# Patient Record
Sex: Female | Born: 1965 | Race: White | Hispanic: No | Marital: Married | Smoking: Former smoker
Health system: Southern US, Community
[De-identification: ages and names within clinical notes are randomized; demographics above are authoritative.]

## PROBLEM LIST (undated history)

## (undated) DIAGNOSIS — E063 Autoimmune thyroiditis: Secondary | ICD-10-CM

## (undated) DIAGNOSIS — E119 Type 2 diabetes mellitus without complications: Secondary | ICD-10-CM

## (undated) DIAGNOSIS — I82409 Acute embolism and thrombosis of unspecified deep veins of unspecified lower extremity: Secondary | ICD-10-CM

## (undated) DIAGNOSIS — R112 Nausea with vomiting, unspecified: Secondary | ICD-10-CM

## (undated) DIAGNOSIS — E079 Disorder of thyroid, unspecified: Secondary | ICD-10-CM

## (undated) DIAGNOSIS — Z9889 Other specified postprocedural states: Secondary | ICD-10-CM

## (undated) DIAGNOSIS — G4730 Sleep apnea, unspecified: Secondary | ICD-10-CM

## (undated) HISTORY — PX: ANKLE SURGERY: SHX546

## (undated) HISTORY — PX: OTHER SURGICAL HISTORY: SHX169

## (undated) HISTORY — PX: NASAL SEPTUM SURGERY: SHX37

## (undated) HISTORY — PX: FRACTURE SURGERY: SHX138

---

## 2005-07-13 ENCOUNTER — Ambulatory Visit: Payer: Self-pay | Admitting: Podiatry

## 2005-07-26 ENCOUNTER — Observation Stay: Payer: Self-pay | Admitting: Internal Medicine

## 2007-02-09 ENCOUNTER — Emergency Department: Payer: Self-pay | Admitting: Internal Medicine

## 2007-07-31 ENCOUNTER — Ambulatory Visit: Payer: Self-pay | Admitting: Unknown Physician Specialty

## 2011-01-15 ENCOUNTER — Ambulatory Visit: Payer: Self-pay | Admitting: Gastroenterology

## 2013-02-16 DIAGNOSIS — E039 Hypothyroidism, unspecified: Secondary | ICD-10-CM | POA: Insufficient documentation

## 2013-07-27 ENCOUNTER — Ambulatory Visit: Payer: Self-pay | Admitting: Gastroenterology

## 2015-11-01 ENCOUNTER — Other Ambulatory Visit: Payer: Self-pay | Admitting: Obstetrics and Gynecology

## 2015-11-01 DIAGNOSIS — Z1231 Encounter for screening mammogram for malignant neoplasm of breast: Secondary | ICD-10-CM

## 2015-11-22 ENCOUNTER — Ambulatory Visit
Admission: RE | Admit: 2015-11-22 | Discharge: 2015-11-22 | Disposition: A | Discharge status: Home / Self Care | Payer: BLUE CROSS/BLUE SHIELD | Source: Ambulatory Visit | Attending: Obstetrics and Gynecology | Admitting: Obstetrics and Gynecology

## 2015-11-22 ENCOUNTER — Other Ambulatory Visit: Payer: Self-pay | Admitting: Obstetrics and Gynecology

## 2015-11-22 DIAGNOSIS — Z1231 Encounter for screening mammogram for malignant neoplasm of breast: Secondary | ICD-10-CM

## 2016-06-21 ENCOUNTER — Ambulatory Visit: Payer: BLUE CROSS/BLUE SHIELD | Attending: Specialist

## 2016-06-21 DIAGNOSIS — G4733 Obstructive sleep apnea (adult) (pediatric): Secondary | ICD-10-CM | POA: Diagnosis present

## 2016-11-07 ENCOUNTER — Encounter: Payer: Self-pay | Admitting: Emergency Medicine

## 2016-11-07 ENCOUNTER — Emergency Department
Admission: EM | Admit: 2016-11-07 | Discharge: 2016-11-07 | Disposition: A | Discharge status: Home / Self Care | Payer: BLUE CROSS/BLUE SHIELD | Attending: Emergency Medicine | Admitting: Emergency Medicine

## 2016-11-07 ENCOUNTER — Emergency Department: Payer: BLUE CROSS/BLUE SHIELD

## 2016-11-07 DIAGNOSIS — R2243 Localized swelling, mass and lump, lower limb, bilateral: Secondary | ICD-10-CM | POA: Diagnosis present

## 2016-11-07 DIAGNOSIS — Z87891 Personal history of nicotine dependence: Secondary | ICD-10-CM | POA: Diagnosis not present

## 2016-11-07 DIAGNOSIS — Z7984 Long term (current) use of oral hypoglycemic drugs: Secondary | ICD-10-CM | POA: Insufficient documentation

## 2016-11-07 DIAGNOSIS — E119 Type 2 diabetes mellitus without complications: Secondary | ICD-10-CM | POA: Diagnosis not present

## 2016-11-07 DIAGNOSIS — W57XXXA Bitten or stung by nonvenomous insect and other nonvenomous arthropods, initial encounter: Secondary | ICD-10-CM | POA: Insufficient documentation

## 2016-11-07 DIAGNOSIS — Z7982 Long term (current) use of aspirin: Secondary | ICD-10-CM | POA: Insufficient documentation

## 2016-11-07 DIAGNOSIS — Z86718 Personal history of other venous thrombosis and embolism: Secondary | ICD-10-CM | POA: Insufficient documentation

## 2016-11-07 DIAGNOSIS — R609 Edema, unspecified: Secondary | ICD-10-CM

## 2016-11-07 DIAGNOSIS — Z7951 Long term (current) use of inhaled steroids: Secondary | ICD-10-CM | POA: Diagnosis not present

## 2016-11-07 HISTORY — DX: Autoimmune thyroiditis: E06.3

## 2016-11-07 HISTORY — DX: Type 2 diabetes mellitus without complications: E11.9

## 2016-11-07 HISTORY — DX: Disorder of thyroid, unspecified: E07.9

## 2016-11-07 HISTORY — DX: Acute embolism and thrombosis of unspecified deep veins of unspecified lower extremity: I82.409

## 2016-11-07 LAB — COMPREHENSIVE METABOLIC PANEL
ALT: 44 U/L (ref 14–54)
AST: 44 U/L — AB (ref 15–41)
Albumin: 4.3 g/dL (ref 3.5–5.0)
Alkaline Phosphatase: 43 U/L (ref 38–126)
Anion gap: 11 (ref 5–15)
BUN: 10 mg/dL (ref 6–20)
CHLORIDE: 102 mmol/L (ref 101–111)
CO2: 24 mmol/L (ref 22–32)
CREATININE: 0.8 mg/dL (ref 0.44–1.00)
Calcium: 9.6 mg/dL (ref 8.9–10.3)
GFR calc Af Amer: >60 mL/min (ref >60)
GFR calc non Af Amer: >60 mL/min (ref >60)
Glucose, Bld: 113 mg/dL — ABNORMAL HIGH (ref 65–99)
POTASSIUM: 4.3 mmol/L (ref 3.5–5.1)
SODIUM: 137 mmol/L (ref 135–145)
Total Bilirubin: 0.5 mg/dL (ref 0.3–1.2)
Total Protein: 7.6 g/dL (ref 6.5–8.1)

## 2016-11-07 LAB — CBC WITH DIFFERENTIAL/PLATELET
BASOS ABS: 0 10*3/uL (ref 0–0.1)
BASOS PCT: 1 %
EOS ABS: 0.2 10*3/uL (ref 0–0.7)
EOS PCT: 3 %
HCT: 34.1 % — ABNORMAL LOW (ref 35.0–47.0)
Hemoglobin: 11.5 g/dL — ABNORMAL LOW (ref 12.0–16.0)
Lymphocytes Relative: 36 %
Lymphs Abs: 2 10*3/uL (ref 1.0–3.6)
MCH: 30.3 pg (ref 26.0–34.0)
MCHC: 33.7 g/dL (ref 32.0–36.0)
MCV: 89.9 fL (ref 80.0–100.0)
MONO ABS: 0.4 10*3/uL (ref 0.2–0.9)
Monocytes Relative: 7 %
Neutro Abs: 2.9 10*3/uL (ref 1.4–6.5)
Neutrophils Relative %: 53 %
PLATELETS: 346 10*3/uL (ref 150–440)
RBC: 3.79 MIL/uL — AB (ref 3.80–5.20)
RDW: 13.9 % (ref 11.5–14.5)
WBC: 5.5 10*3/uL (ref 3.6–11.0)

## 2016-11-07 LAB — PROTIME-INR
INR: 0.95
PROTHROMBIN TIME: 12.7 s (ref 11.4–15.2)

## 2016-11-07 NOTE — ED Provider Notes (Signed)
[facility] Emergency Department Provider Note  ____________________________________________   First MD Initiated Contact with Patient 11/07/16 [phone]     (approximate)  I have reviewed the triage vital signs and the nursing notes.   HISTORY  Chief Complaint Leg Swelling   HPI Lynn Leonard is a 51 y.o. female with a history of DVT after surgery 2008 who is presenting to the emergency department with bilateral leg swelling over the past 2 weeks. She says that she attended a three-way conference in [place] recently flew back from [place]. She says that she was sitting. Return to the conference and has had swelling throughout the day until she goes to bed and when she wakes up in the morning the swelling is improved. She is also noted bruising to her right thigh, medially and posteriorly and was concerned about this. She says that she also has multiple lesions and was worried about hives to the bilateral lower extremities. Says that they are itchy. Nobody else in her home has them. Says that the lesions started after the swelling. Says that she is also been wearing compression hose that of resolve the swelling, especially this morning when she does not note any swelling at all. Denies any lesions to the web spaces of her hands.   Past Medical History:  Diagnosis Date  . Diabetes mellitus without complication ([place])   . DVT (deep venous thrombosis) ([place])   . Hashimoto's thyroiditis   . Thyroid disease     There are no active problems to display for this patient.   Past Surgical History:  Procedure Laterality Date  . ANKLE SURGERY    . uterine ablation      Prior to Admission medications   Medication Sig Start Date End Date Taking? Authorizing Provider  aspirin EC 81 MG tablet Take 81 mg by mouth daily.   Yes [provider]  atorvastatin (LIPITOR) 40 MG tablet Take 40 mg by mouth daily. 10/29/16  Yes [provider]  FLUoxetine (PROZAC) 40  MG capsule Take 40 mg by mouth daily. 09/27/16  Yes [provider]  fluticasone (FLONASE) 50 MCG/ACT nasal spray Place 2 sprays into both nostrils daily. 08/22/16  Yes [provider]  levothyroxine (SYNTHROID, LEVOTHROID) 200 MCG tablet Take 200 mcg by mouth daily. 10/29/16  Yes [provider]  metFORMIN (GLUCOPHAGE) 500 MG tablet Take 500 mg by mouth daily. 08/22/16  Yes [provider]  traZODone (DESYREL) 50 MG tablet Take 50 mg by mouth at bedtime. 10/05/16  Yes [provider]    Allergies Shellfish allergy  Family History  Problem Relation Age of Onset  . Breast cancer Mother 36    Social History Social History  Substance Use Topics  . Smoking status: Former Research scientist (life sciences)  . Smokeless tobacco: Not on file  . Alcohol use Not on file    Review of Systems  Constitutional: No fever/chills Eyes: No visual changes. ENT: No sore throat. Cardiovascular: Denies chest pain. Respiratory: Denies shortness of breath. Gastrointestinal: No abdominal pain.  No nausea, no vomiting.  No diarrhea.  No constipation. Genitourinary: Negative for dysuria. Musculoskeletal: Negative for back pain. Skin: Negative for rash. Neurological: Negative for headaches, focal weakness or numbness.   ____________________________________________   PHYSICAL EXAM:  VITAL SIGNS: ED Triage Vitals  Enc Vitals Group     BP 11/07/16 0946 (!) 159/87     Pulse Rate 11/07/16 0946 83     Resp 11/07/16 0946 18  Temp 11/07/16 0946 99 F (37.2 C)     Temp Source 11/07/16 0946 Oral     SpO2 11/07/16 0946 98 %     Weight 11/07/16 0949 278 lb (126.1 kg)     Height 11/07/16 0949 5\' 4"  (1.626 m)     Head Circumference --      Peak Flow --      Pain Score 11/07/16 0945 4     Pain Loc --      Pain Edu? --      Excl. in [place]? --     Constitutional: Alert and oriented. Well appearing and in no acute distress. Eyes: Conjunctivae are normal.  Head: Atraumatic. Nose: No  congestion/rhinnorhea. Mouth/Throat: Mucous membranes are moist.  Neck: No stridor.   Cardiovascular: Normal rate, regular rhythm. Grossly normal heart sounds.   Respiratory: Normal respiratory effort.  No retractions. Lungs CTAB. Gastrointestinal: Soft and nontender. No distention. No CVA tenderness. Musculoskeletal: No lower extremity tenderness nor edema.  3-4 cm oval-shaped spot of ecchymosis to the right, medial and posterior thigh. Multiple 2 mm to 1 cm, round and raised and red nodules. There are no lesions in the web spaces of the hands or feet. No surrounding induration, erythema or pus. Dorsalis pedis pulses are present and equal bilaterally. Neurologic:  Normal speech and language. No gross focal neurologic deficits are appreciated. Skin:  Skin is warm, dry and intact. No rash noted. Psychiatric: Mood and affect are normal. Speech and behavior are normal.  ____________________________________________   LABS (all labs ordered are listed, but only abnormal results are displayed)  Labs Reviewed  CBC WITH DIFFERENTIAL/PLATELET - Abnormal; Notable for the following:       Result Value   RBC 3.79 (*)    Hemoglobin 11.5 (*)    HCT 34.1 (*)    All other components within normal limits  COMPREHENSIVE METABOLIC PANEL - Abnormal; Notable for the following:    Glucose, Bld 113 (*)    AST 44 (*)    All other components within normal limits  PROTIME-INR   ____________________________________________  EKG   ____________________________________________  RADIOLOGY  No evidence of DVT ____________________________________________   PROCEDURES  Procedure(s) performed:   Procedures  Critical Care performed:   ____________________________________________   INITIAL IMPRESSION / ASSESSMENT AND PLAN / ED COURSE  Pertinent labs & imaging results that were available during my care of the patient were reviewed by me and considered in my medical decision making (see chart for  details).  Concern is for bug bites to the bilateral lower extremities. She was staying in a hotel for 3 weeks so it is possible that there may have been a bedbug infestation. She denies any family members who she is living with currently having the lesions. Also possible more benign cause of mosquito bites.    ----------------------------------------- 10:58 AM on 11/07/2016 -----------------------------------------  Patient at this time is resting comfortably. Very reassuring workup including negative ultrasound for DVT. Patient likely with peripheral edema. She is wearing her compression stockings intermittently and also says that she is trying to lose weight. I believe that both these things will help the patient with her peripheral edema. Furthermore, the inflammation secondary what appears insect bites may be contributing to the peripheral edema. Patient will continue with Benadryl topically for this.  ____________________________________________   FINAL CLINICAL IMPRESSION(S) / ED DIAGNOSES  Peripheral edema. Insect bites.    NEW MEDICATIONS STARTED DURING THIS VISIT:  New Prescriptions   No medications on file  Note:  This document was prepared using Dragon voice recognition software and may include unintentional dictation errors.     Orbie Pyo, MD 11/07/16 1100

## 2016-11-07 NOTE — ED Notes (Addendum)
EDP at bedside, pt states hx of DVT, per pt right leg swelling and brusing and swelling noted to left leg as well, pt ambulatory to room, cap refill <3 secs, warm and intact, pt also states some "bites" on bilateral legs and arms,  pt awake and alert in no acute distress

## 2016-11-07 NOTE — ED Triage Notes (Signed)
Pt c/o BL LE swelling for the past week with dull ache, states she has a hx of DVT and concerned. States she just got back from [place] last week. Traveled by airplane.

## 2016-11-07 NOTE — ED Notes (Addendum)
Pt to [place] with [place] tech.

## 2017-04-02 ENCOUNTER — Other Ambulatory Visit: Payer: Self-pay | Admitting: Obstetrics and Gynecology

## 2017-04-02 DIAGNOSIS — Z1231 Encounter for screening mammogram for malignant neoplasm of breast: Secondary | ICD-10-CM

## 2017-04-23 ENCOUNTER — Ambulatory Visit
Admission: RE | Admit: 2017-04-23 | Discharge: 2017-04-23 | Disposition: A | Discharge status: Home / Self Care | Payer: BLUE CROSS/BLUE SHIELD | Source: Ambulatory Visit | Attending: Obstetrics and Gynecology | Admitting: Obstetrics and Gynecology

## 2017-04-23 DIAGNOSIS — Z1231 Encounter for screening mammogram for malignant neoplasm of breast: Secondary | ICD-10-CM | POA: Insufficient documentation

## 2017-05-16 DIAGNOSIS — Z8719 Personal history of other diseases of the digestive system: Secondary | ICD-10-CM | POA: Insufficient documentation

## 2017-07-09 DIAGNOSIS — Z7189 Other specified counseling: Secondary | ICD-10-CM | POA: Insufficient documentation

## 2017-07-09 DIAGNOSIS — Z7185 Encounter for immunization safety counseling: Secondary | ICD-10-CM | POA: Insufficient documentation

## 2017-09-25 ENCOUNTER — Encounter: Payer: Self-pay | Admitting: Adult Health

## 2017-09-25 ENCOUNTER — Ambulatory Visit: Payer: Self-pay | Admitting: Adult Health

## 2017-09-25 VITALS — BP 138/87 | HR 74 | Temp 98.9°F | Resp 16 | Ht 65.0 in | Wt 251.0 lb

## 2017-09-25 DIAGNOSIS — D509 Iron deficiency anemia, unspecified: Secondary | ICD-10-CM | POA: Insufficient documentation

## 2017-09-25 DIAGNOSIS — F419 Anxiety disorder, unspecified: Secondary | ICD-10-CM | POA: Insufficient documentation

## 2017-09-25 DIAGNOSIS — H6983 Other specified disorders of Eustachian tube, bilateral: Secondary | ICD-10-CM

## 2017-09-25 DIAGNOSIS — E782 Mixed hyperlipidemia: Secondary | ICD-10-CM | POA: Insufficient documentation

## 2017-09-25 DIAGNOSIS — J0100 Acute maxillary sinusitis, unspecified: Secondary | ICD-10-CM

## 2017-09-25 DIAGNOSIS — E119 Type 2 diabetes mellitus without complications: Secondary | ICD-10-CM | POA: Insufficient documentation

## 2017-09-25 MED ORDER — PREDNISONE 10 MG (21) PO TBPK: ORAL_TABLET | ORAL | 0 refills | Status: DC

## 2017-09-25 MED ORDER — AMOXICILLIN-POT CLAVULANATE 875-125 MG PO TABS: 1.0000 | ORAL_TABLET | Freq: Two times a day (BID) | ORAL | 0 refills | Status: DC

## 2017-09-25 NOTE — Progress Notes (Signed)
Subjective:     Patient ID: Lynn Leonard, female   DOB: 10-Feb-1966, 52 y.o.   MRN: [id number]  HPI   Blood pressure 138/87, pulse 74, temperature 98.9 F (37.2 C), resp. rate 16, height 5\' 5"  (1.651 m), weight 251 lb (113.9 kg), SpO2 98 %.No LMP recorded. Patient has had an ablation.  Patient is a 52 year old female in no acute distress who comes to the clinic with  Sinus pressure the past four days she has had allergy issues with post nasal drip and congestion weeks before that Maxillary sinus pain x 3 days. Denies cough.   Patient  denies any fever, any unusual body aches,chills, rash, chest pain, shortness of breath, nausea, vomiting, or diarrhea.   She had 99- 100.1 oral temperature started on this Monday 09/23/17.     No LMP recorded. Patient has had an ablation.  Review of Systems  Constitutional: Positive for fatigue and fever. Negative for activity change, appetite change, chills, diaphoresis and unexpected weight change.  HENT: Positive for congestion, ear pain (" pressure " ), postnasal drip, rhinorrhea and sinus pressure. Negative for sinus pain and sore throat.   Respiratory: Negative.   Cardiovascular: Negative.   Gastrointestinal: Negative.   Endocrine: Negative.   Musculoskeletal: Negative.   Skin: Negative.   Allergic/Immunologic: Negative.   Neurological: Negative.   Hematological: Negative.   Psychiatric/Behavioral: Negative.        Objective:   Physical Exam  Constitutional: She is oriented to person, place, and time. Vital signs are normal. She appears well-developed and well-nourished. She is active.  Non-toxic appearance. She does not have a sickly appearance. She does not appear ill. No distress. She is not intubated.  Patient moves on and off of exam table and in room without difficulty. Gait is normal in hall and in room. Patient is oriented to person place time and situation. Patient answers questions appropriately and engages in  conversation.  Patient is alert and oriented and responsive to questions Engages in eye contact with provider. Speaks in full sentences without any pauses without any shortness of breath.    HENT:  Head: Normocephalic and atraumatic.  Right Ear: Hearing, external ear and ear canal normal. Tympanic membrane is not perforated and not erythematous. A middle ear effusion is present.  Left Ear: Hearing, external ear and ear canal normal. Tympanic membrane is erythematous. Tympanic membrane is not perforated. A middle ear effusion is present.  Nose: Mucosal edema and rhinorrhea present. Right sinus exhibits maxillary sinus tenderness. Right sinus exhibits no frontal sinus tenderness. Left sinus exhibits maxillary sinus tenderness. Left sinus exhibits no frontal sinus tenderness.  Mouth/Throat: Uvula is midline, oropharynx is clear and moist and mucous membranes are normal. No uvula swelling. No oropharyngeal exudate, posterior oropharyngeal edema, posterior oropharyngeal erythema or tonsillar abscesses. No tonsillar exudate.  Eyes: Pupils are equal, round, and reactive to light. Conjunctivae, EOM and lids are normal. Right eye exhibits no discharge. Left eye exhibits no discharge. No scleral icterus.  Neck: Trachea normal, normal range of motion, full passive range of motion without pain and phonation normal. Neck supple. No JVD present. No tracheal tenderness present. No tracheal deviation present. No Brudzinski's sign noted.  Cardiovascular: Normal rate, regular rhythm, normal heart sounds and intact distal pulses. Exam reveals no gallop and no friction rub.  No murmur heard. Pulmonary/Chest: Effort normal and breath sounds normal. No accessory muscle usage or stridor. No apnea, no tachypnea and no bradypnea. She is not intubated. No respiratory  distress. She has no wheezes. She has no rales. She exhibits no tenderness.  Abdominal: Soft. Bowel sounds are normal.  Musculoskeletal: Normal range of motion.   Lymphadenopathy:       Head (right side): No submental, no submandibular, no tonsillar, no preauricular, no posterior auricular and no occipital adenopathy present.       Head (left side): No submental, no submandibular, no tonsillar, no preauricular, no posterior auricular and no occipital adenopathy present.    She has no cervical adenopathy.  Neurological: She is alert and oriented to person, place, and time. She displays normal reflexes. No cranial nerve deficit. She exhibits normal muscle tone. Coordination normal.  Skin: Skin is warm and dry. Capillary refill takes less than 2 seconds. No rash noted. She is not diaphoretic. No cyanosis or erythema. No pallor. Nails show no clubbing.  Psychiatric: She has a normal mood and affect. Her speech is normal and behavior is normal. Judgment and thought content normal. Cognition and memory are normal.  Vitals reviewed.      Assessment:       Eustachian tube dysfunction, bilateral  Acute non-recurrent maxillary sinusitis   Plan:     Meds ordered this encounter  Medications  . amoxicillin-clavulanate (AUGMENTIN) 875-125 MG tablet    Sig: Take 1 tablet by mouth 2 (two) times daily.    Dispense:  20 tablet    Refill:  0  . predniSONE (STERAPRED UNI-PAK 21 TAB) 10 MG (21) TBPK tablet    Sig: PO: Take 6 tablets on day 1, Take 5 tablets day 2 Take 4 tablets day 3 Take 3 tablets day 4 Take 2 tablets day five 5 Take 1 tablet day 6    Dispense:  21 tablet    Refill:  0   She will try Flonase and hold prednisone if ear pressure does not improve within 2- 3 days may start prednisone.  Continue Over the counter allergy medication.    Advised patient call the office or your primary care doctor for an appointment if no improvement within 72 hours or if any symptoms change or worsen at any time  Advised ER or urgent Care if after hours or on weekend. Call 911 for emergency symptoms at any time.Patinet verbalized understanding of all instructions  given/reviewed and treatment plan and has no further questions or concerns at this time.    Patient verbalized understanding of all instructions given and denies any further questions at this time.

## 2017-09-25 NOTE — Patient Instructions (Signed)

## 2018-05-27 ENCOUNTER — Other Ambulatory Visit: Payer: Self-pay | Admitting: Family Medicine

## 2018-05-27 DIAGNOSIS — Z1231 Encounter for screening mammogram for malignant neoplasm of breast: Secondary | ICD-10-CM

## 2018-06-11 ENCOUNTER — Ambulatory Visit
Admission: RE | Admit: 2018-06-11 | Discharge: 2018-06-11 | Disposition: A | Discharge status: Home / Self Care | Payer: BLUE CROSS/BLUE SHIELD | Source: Ambulatory Visit | Attending: Family Medicine | Admitting: Family Medicine

## 2018-06-11 DIAGNOSIS — Z1231 Encounter for screening mammogram for malignant neoplasm of breast: Secondary | ICD-10-CM | POA: Diagnosis not present

## 2018-10-13 DIAGNOSIS — Z83719 Family history of colon polyps, unspecified: Secondary | ICD-10-CM | POA: Insufficient documentation

## 2018-10-21 ENCOUNTER — Other Ambulatory Visit
Admission: RE | Admit: 2018-10-21 | Discharge: 2018-10-21 | Disposition: A | Discharge status: Home / Self Care | Payer: BC Managed Care – PPO | Source: Ambulatory Visit | Attending: Unknown Physician Specialty | Admitting: Unknown Physician Specialty

## 2018-10-21 ENCOUNTER — Other Ambulatory Visit: Payer: Self-pay

## 2018-10-21 DIAGNOSIS — Z1159 Encounter for screening for other viral diseases: Secondary | ICD-10-CM | POA: Diagnosis not present

## 2018-10-22 LAB — NOVEL CORONAVIRUS, NAA (HOSP ORDER, SEND-OUT TO REF LAB; TAT 18-24 HRS): SARS-CoV-2, NAA: NOT DETECTED

## 2018-10-24 ENCOUNTER — Ambulatory Visit: Payer: BC Managed Care – PPO | Admitting: Anesthesiology

## 2018-10-24 ENCOUNTER — Encounter: Payer: Self-pay | Admitting: *Deleted

## 2018-10-24 ENCOUNTER — Encounter
Admission: RE | Disposition: A | Discharge status: Home / Self Care | Payer: Self-pay | Source: Home / Self Care | Attending: Unknown Physician Specialty

## 2018-10-24 ENCOUNTER — Other Ambulatory Visit: Payer: Self-pay

## 2018-10-24 ENCOUNTER — Ambulatory Visit
Admission: RE | Admit: 2018-10-24 | Discharge: 2018-10-24 | Disposition: A | Discharge status: Home / Self Care | Payer: BC Managed Care – PPO | Attending: Unknown Physician Specialty | Admitting: Unknown Physician Specialty

## 2018-10-24 DIAGNOSIS — Z86718 Personal history of other venous thrombosis and embolism: Secondary | ICD-10-CM | POA: Insufficient documentation

## 2018-10-24 DIAGNOSIS — K635 Polyp of colon: Secondary | ICD-10-CM | POA: Diagnosis not present

## 2018-10-24 DIAGNOSIS — E063 Autoimmune thyroiditis: Secondary | ICD-10-CM | POA: Insufficient documentation

## 2018-10-24 DIAGNOSIS — Z6841 Body Mass Index (BMI) 40.0 and over, adult: Secondary | ICD-10-CM | POA: Insufficient documentation

## 2018-10-24 DIAGNOSIS — Z79899 Other long term (current) drug therapy: Secondary | ICD-10-CM | POA: Insufficient documentation

## 2018-10-24 DIAGNOSIS — K5730 Diverticulosis of large intestine without perforation or abscess without bleeding: Secondary | ICD-10-CM | POA: Insufficient documentation

## 2018-10-24 DIAGNOSIS — Z86010 Personal history of colonic polyps: Secondary | ICD-10-CM | POA: Insufficient documentation

## 2018-10-24 DIAGNOSIS — Z7984 Long term (current) use of oral hypoglycemic drugs: Secondary | ICD-10-CM | POA: Diagnosis not present

## 2018-10-24 DIAGNOSIS — D123 Benign neoplasm of transverse colon: Secondary | ICD-10-CM | POA: Insufficient documentation

## 2018-10-24 DIAGNOSIS — G4730 Sleep apnea, unspecified: Secondary | ICD-10-CM | POA: Insufficient documentation

## 2018-10-24 DIAGNOSIS — Z87891 Personal history of nicotine dependence: Secondary | ICD-10-CM | POA: Insufficient documentation

## 2018-10-24 DIAGNOSIS — Z1211 Encounter for screening for malignant neoplasm of colon: Secondary | ICD-10-CM | POA: Diagnosis not present

## 2018-10-24 DIAGNOSIS — F419 Anxiety disorder, unspecified: Secondary | ICD-10-CM | POA: Insufficient documentation

## 2018-10-24 DIAGNOSIS — E119 Type 2 diabetes mellitus without complications: Secondary | ICD-10-CM | POA: Diagnosis not present

## 2018-10-24 DIAGNOSIS — Z7982 Long term (current) use of aspirin: Secondary | ICD-10-CM | POA: Insufficient documentation

## 2018-10-24 DIAGNOSIS — Z79890 Hormone replacement therapy (postmenopausal): Secondary | ICD-10-CM | POA: Insufficient documentation

## 2018-10-24 HISTORY — PX: COLONOSCOPY WITH PROPOFOL: SHX5780

## 2018-10-24 HISTORY — DX: Other specified postprocedural states: Z98.890

## 2018-10-24 HISTORY — DX: Other specified postprocedural states: R11.2

## 2018-10-24 LAB — POCT PREGNANCY, URINE: Preg Test, Ur: NEGATIVE

## 2018-10-24 SURGERY — COLONOSCOPY WITH PROPOFOL
Anesthesia: General

## 2018-10-24 MED ORDER — SODIUM CHLORIDE 0.9 % IV SOLN: INTRAVENOUS | Status: DC

## 2018-10-24 MED ORDER — PROPOFOL 500 MG/50ML IV EMUL
INTRAVENOUS | Status: AC
  Filled 2018-10-24: qty 50

## 2018-10-24 MED ORDER — PROPOFOL 500 MG/50ML IV EMUL
INTRAVENOUS | Status: DC | PRN
  Administered 2018-10-24: 200 ug/kg/min via INTRAVENOUS

## 2018-10-24 MED ORDER — PROPOFOL 10 MG/ML IV BOLUS
INTRAVENOUS | Status: AC
  Filled 2018-10-24: qty 20

## 2018-10-24 MED ORDER — LIDOCAINE HCL URETHRAL/MUCOSAL 2 % EX GEL
CUTANEOUS | Status: AC
Start: 2018-10-24 — End: ?
  Filled 2018-10-24: qty 5

## 2018-10-24 MED ORDER — SODIUM CHLORIDE 0.9 % IV SOLN
INTRAVENOUS | Status: DC
  Administered 2018-10-24: 1000 mL via INTRAVENOUS

## 2018-10-24 MED ORDER — PROPOFOL 10 MG/ML IV BOLUS
INTRAVENOUS | Status: DC | PRN
  Administered 2018-10-24: 20 mg via INTRAVENOUS
  Administered 2018-10-24: 80 mg via INTRAVENOUS

## 2018-10-24 MED ORDER — LIDOCAINE HCL (CARDIAC) PF 100 MG/5ML IV SOSY
PREFILLED_SYRINGE | INTRAVENOUS | Status: DC | PRN
  Administered 2018-10-24: 100 mg via INTRAVENOUS

## 2018-10-24 NOTE — Op Note (Addendum)
[facility] Gastroenterology Patient Name: Lynn Leonard Procedure Date: 10/24/2018 9:52 AM MRN: [id number] Account #: [id number] Date of Birth: 1965/07/22 Admit Type: Outpatient Age: 53 Room: [facility] ENDO ROOM 4 Gender: Female Note Status: Finalized Procedure:            Colonoscopy Indications:          High risk colon cancer surveillance: Personal history                        of colonic polyps Providers:            Manya Silvas, MD Medicines:            Propofol per Anesthesia Complications:        No immediate complications. Procedure:            Pre-Anesthesia Assessment:                       - After reviewing the risks and benefits, the patient                        was deemed in satisfactory condition to undergo the                        procedure.                       After obtaining informed consent, the colonoscope was                        passed under direct vision. Throughout the procedure,                        the patient's blood pressure, pulse, and oxygen                        saturations were monitored continuously. The was                        introduced through the anus and advanced to the the                        ascending colon. The colonoscopy was somewhat difficult                        due to a redundant colon, significant looping and a                        tortuous colon. Successful completion of the procedure                        was aided by applying abdominal pressure. The patient                        tolerated the procedure well. Findings:      A diminutive polyp was found in the transverse colon. The polyp was       sessile. The polyp was removed with a jumbo cold forceps. Resection and       retrieval were complete.      A small polyp was found in the transverse colon. The polyp was sessile.  The polyp was removed with a jumbo cold forceps. Resection and retrieval       were complete.      A small polyp  was found in the recto-sigmoid colon. The polyp was       sessile. The polyp was removed with a jumbo cold forceps. Resection and       retrieval were complete.      I could see into the ascending colon and a portion of the cecum but       could not quite reach the cecum.      Multiple medium-mouthed diverticula were found in the sigmoid colon,       descending colon, transverse colon and ascending colon.      The exam was otherwise without abnormality. Impression:           - One diminutive polyp in the transverse colon, removed                        with a jumbo cold forceps. Resected and retrieved.                       - One small polyp in the transverse colon, removed with                        a jumbo cold forceps. Resected and retrieved.                       - One small polyp at the recto-sigmoid colon, removed                        with a jumbo cold forceps. Resected and retrieved.                       - Diverticulosis in the sigmoid colon, in the                        descending colon, in the transverse colon and in the                        ascending colon.                       - The examination was otherwise normal. Recommendation:       - Await pathology results. Manya Silvas, MD 10/24/2018 10:52:22 AM This report has been signed electronically. Number of Addenda: 0 Note Initiated On: 10/24/2018 9:52 AM Scope Withdrawal Time: 0 hours 8 minutes 13 seconds  Total Procedure Duration: 0 hours 33 minutes 31 seconds  Estimated Blood Loss: Estimated blood loss: none.      [facility]

## 2018-10-24 NOTE — H&P (Signed)
Primary Care Physician:  Dion Body, MD Primary Gastroenterologist:  Dr. Vira Agar  Pre-Procedure History & Physical: HPI:  Lynn Leonard is a 53 y.o. female is here for an colonoscopy.  Done for colon cancer screening.   Past Medical History:  Diagnosis Date  . Diabetes mellitus without complication ([place])   . DVT (deep venous thrombosis) ([place])   . Hashimoto's thyroiditis   . PONV (postoperative nausea and vomiting)   . Thyroid disease     Past Surgical History:  Procedure Laterality Date  . ANKLE SURGERY    . FRACTURE SURGERY    . uterine ablation      Prior to Admission medications   Medication Sig Start Date End Date Taking? Authorizing Provider  aspirin EC 81 MG tablet Take 81 mg by mouth daily.   Yes [provider]  atorvastatin (LIPITOR) 40 MG tablet Take 40 mg by mouth daily. 10/29/16  Yes [provider]  FLUoxetine (PROZAC) 40 MG capsule Take 40 mg by mouth daily. 09/27/16  Yes [provider]  fluticasone (FLONASE) 50 MCG/ACT nasal spray Place 2 sprays into both nostrils daily. 08/22/16  Yes [provider]  levothyroxine (SYNTHROID, LEVOTHROID) 200 MCG tablet Take 200 mcg by mouth daily. 10/29/16  Yes [provider]  liothyronine (CYTOMEL) 5 MCG tablet Take 5 mcg by mouth daily.   Yes [provider]  lisinopril (ZESTRIL) 5 MG tablet Take 5 mg by mouth daily.   Yes [provider]  metFORMIN (GLUCOPHAGE) 500 MG tablet Take 500 mg by mouth daily. 08/22/16  Yes [provider]  traZODone (DESYREL) 50 MG tablet Take 50 mg by mouth at bedtime. 10/05/16  Yes [provider]  amoxicillin-clavulanate (AUGMENTIN) 875-125 MG tablet Take 1 tablet by mouth 2 (two) times daily. Patient not taking: Reported on 10/24/2018 09/25/17   Flinchum, Kelby Aline, FNP  predniSONE (STERAPRED UNI-PAK 21 TAB) 10 MG (21) TBPK tablet PO: Take 6 tablets on day 1, Take 5 tablets day 2 Take 4 tablets day 3 Take 3  tablets day 4 Take 2 tablets day five 5 Take 1 tablet day 6 Patient not taking: Reported on 10/24/2018 09/25/17   Doreen Beam, FNP    Allergies as of 10/20/2018 - Review Complete 09/25/2017  Allergen Reaction Noted  . Shellfish allergy Anaphylaxis 11/07/2016    Family History  Problem Relation Age of Onset  . Breast cancer Mother 45    Social History   Socioeconomic History  . Marital status: Married    Spouse name: Not on file  . Number of children: Not on file  . Years of education: Not on file  . Highest education level: Not on file  Occupational History  . Not on file  Social Needs  . Financial resource strain: Not on file  . Food insecurity    Worry: Not on file    Inability: Not on file  . Transportation needs    Medical: Not on file    Non-medical: Not on file  Tobacco Use  . Smoking status: Former Research scientist (life sciences)  . Smokeless tobacco: Never Used  Substance and Sexual Activity  . Alcohol use: Yes  . Drug use: Never  . Sexual activity: Not on file  Lifestyle  . Physical activity    Days per week: Not on file    Minutes per session: Not on file  . Stress: Not on file  Relationships  . Social connections    Talks on phone: Not on file  Gets together: Not on file    Attends religious service: Not on file    Active member of club or organization: Not on file    Attends meetings of clubs or organizations: Not on file    Relationship status: Not on file  . Intimate partner violence    Fear of current or ex partner: Not on file    Emotionally abused: Not on file    Physically abused: Not on file    Forced sexual activity: Not on file  Other Topics Concern  . Not on file  Social History Narrative  . Not on file    Review of Systems: See HPI, otherwise negative ROS  Physical Exam: BP 124/70   Pulse 64   Temp (!) 96.9 F (36.1 C) (Tympanic)   Resp 18   Ht 5\' 5"  (1.651 m)   Wt 121.6 kg   SpO2 98%   BMI 44.60 kg/m  General:   Alert,  pleasant and  cooperative in NAD Head:  Normocephalic and atraumatic. Neck:  Supple; no masses or thyromegaly. Lungs:  Clear throughout to auscultation.    Heart:  Regular rate and rhythm. Abdomen:  Soft, nontender and nondistended. Normal bowel sounds, without guarding, and without rebound.   Neurologic:  Alert and  oriented x4;  grossly normal neurologically.  Impression/Plan: Lynn Leonard is here for an colonoscopy to be performed for colon cancer screening.  Risks, benefits, limitations, and alternatives regarding  colonoscopy have been reviewed with the patient.  Questions have been answered.  All parties agreeable.   Gaylyn Cheers, MD  10/24/2018, 10:01 AM

## 2018-10-24 NOTE — Anesthesia Post-op Follow-up Note (Signed)
Anesthesia QCDR form completed.        

## 2018-10-24 NOTE — Anesthesia Preprocedure Evaluation (Signed)
Anesthesia Evaluation  Patient identified by MRN, date of birth, ID band Patient awake    Reviewed: Allergy & Precautions, H&P , NPO status , Patient's Chart, lab work & pertinent test results, reviewed documented beta blocker date and time   History of Anesthesia Complications (+) PONV and history of anesthetic complications  Airway Mallampati: III  TM Distance: >3 FB Neck ROM: full  Mouth opening: Limited Mouth Opening  Dental  (+) Dental Advidsory Given, Teeth Intact   Pulmonary neg shortness of breath, sleep apnea and Continuous Positive Airway Pressure Ventilation , neg COPD, neg recent URI, former smoker,           Cardiovascular Exercise Tolerance: Good negative cardio ROS       Neuro/Psych PSYCHIATRIC DISORDERS Anxiety negative neurological ROS     GI/Hepatic negative GI ROS, Neg liver ROS,   Endo/Other  diabetesHypothyroidism Morbid obesity  Renal/GU negative Renal ROS  negative genitourinary   Musculoskeletal   Abdominal   Peds  Hematology  (+) Blood dyscrasia, anemia ,   Anesthesia Other Findings Past Medical History: No date: Diabetes mellitus without complication (H[facility]) No date: DVT (deep venous thrombosis) (H[facility]) No date: Hashimoto's thyroiditis No date: PONV (postoperative nausea and vomiting) No date: Thyroid disease   Reproductive/Obstetrics negative OB ROS                             Anesthesia Physical Anesthesia Plan  ASA: III  Anesthesia Plan: General   Post-op Pain Management:    Induction: Intravenous  PONV Risk Score and Plan: 4 or greater and Propofol infusion and TIVA  Airway Management Planned: Natural Airway and Nasal Cannula  Additional Equipment:   Intra-op Plan:   Post-operative Plan:   Informed Consent: I have reviewed the patients History and Physical, chart, labs and discussed the procedure including the risks, benefits and alternatives  for the proposed anesthesia with the patient or authorized representative who has indicated his/her understanding and acceptance.     Dental Advisory Given  Plan Discussed with: Anesthesiologist, CRNA and Surgeon  Anesthesia Plan Comments:         Anesthesia Quick Evaluation

## 2018-10-24 NOTE — Anesthesia Postprocedure Evaluation (Signed)
Anesthesia Post Note  Patient: Lynn Leonard  Procedure(s) Performed: COLONOSCOPY WITH PROPOFOL (N/A )  Patient location during evaluation: PACU Anesthesia Type: General Level of consciousness: awake and alert Pain management: pain level controlled Vital Signs Assessment: post-procedure vital signs reviewed and stable Respiratory status: spontaneous breathing, nonlabored ventilation and respiratory function stable Cardiovascular status: blood pressure returned to baseline and stable Postop Assessment: no apparent nausea or vomiting Anesthetic complications: no     Last Vitals:  Vitals:   10/24/18 0913 10/24/18 1050  BP: 124/70 (!) 95/44  Pulse: 64 70  Resp: 18 12  Temp: (!) 36.1 C (!) 36.2 C  SpO2: 98% 96%    Last Pain:  Vitals:   10/24/18 1050  TempSrc: Tympanic  PainSc: 0-No pain                 Durenda Hurt

## 2018-10-24 NOTE — Transfer of Care (Signed)
Immediate Anesthesia Transfer of Care Note  Patient: Lynn Leonard  Procedure(s) Performed: COLONOSCOPY WITH PROPOFOL (N/A )  Patient Location: PACU  Anesthesia Type:General  Level of Consciousness: awake and alert   Airway & Oxygen Therapy: Patient Spontanous Breathing  Post-op Assessment: Report given to RN  Post vital signs: stable  Last Vitals:  Vitals Value Taken Time  BP 95/44 10/24/18 1050  Temp 36.2 C 10/24/18 1050  Pulse 72 10/24/18 1051  Resp 20 10/24/18 1051  SpO2 96 % 10/24/18 1051  Vitals shown include unvalidated device data.  Last Pain:  Vitals:   10/24/18 1050  TempSrc: Tympanic  PainSc: 0-No pain         Complications: No apparent anesthesia complications

## 2018-10-28 LAB — SURGICAL PATHOLOGY

## 2019-07-18 ENCOUNTER — Ambulatory Visit: Payer: Self-pay | Attending: Internal Medicine

## 2019-07-18 ENCOUNTER — Other Ambulatory Visit: Payer: Self-pay

## 2019-07-18 DIAGNOSIS — Z23 Encounter for immunization: Secondary | ICD-10-CM

## 2019-07-18 NOTE — Progress Notes (Signed)
   Covid-19 Vaccination Clinic  Name:  Lynn Leonard    MRN: [id number] DOB: 02/07/1966  07/18/2019  [name] was observed post Covid-19 immunization for 15 minutes without incident. She was provided with Vaccine Information Sheet and instruction to access the V-Safe system.   [name] was instructed to call 911 with any severe reactions post vaccine: Marland Kitchen Difficulty breathing  . Swelling of face and throat  . A fast heartbeat  . A bad rash all over body  . Dizziness and weakness   Immunizations Administered    Name Date Dose VIS Date Route   Pfizer COVID-19 Vaccine 07/18/2019  9:57 AM 0.3 mL 04/17/2019 Intramuscular   Manufacturer: [place]   Lot: [id number]   [place]: [id number]

## 2019-08-12 ENCOUNTER — Ambulatory Visit: Payer: Self-pay | Attending: Internal Medicine

## 2019-08-12 DIAGNOSIS — Z23 Encounter for immunization: Secondary | ICD-10-CM

## 2019-08-12 NOTE — Progress Notes (Signed)
   Covid-19 Vaccination Clinic  Name:  Lynn Leonard    MRN: [id number] DOB: 02-May-1966  08/12/2019  [name] was observed post Covid-19 immunization for 15 minutes without incident. She was provided with Vaccine Information Sheet and instruction to access the V-Safe system.   [name] was instructed to call 911 with any severe reactions post vaccine: Marland Kitchen Difficulty breathing  . Swelling of face and throat  . A fast heartbeat  . A bad rash all over body  . Dizziness and weakness   Immunizations Administered    Name Date Dose VIS Date Route   Pfizer COVID-19 Vaccine 08/12/2019  9:28 AM 0.3 mL 04/17/2019 Intramuscular   Manufacturer: [place]   Lot: [id number]   [place]: [id number]

## 2019-09-04 ENCOUNTER — Other Ambulatory Visit: Payer: Self-pay | Admitting: Family Medicine

## 2019-09-04 DIAGNOSIS — Z1231 Encounter for screening mammogram for malignant neoplasm of breast: Secondary | ICD-10-CM

## 2019-09-07 ENCOUNTER — Ambulatory Visit
Admission: RE | Admit: 2019-09-07 | Discharge: 2019-09-07 | Disposition: A | Discharge status: Home / Self Care | Payer: BC Managed Care – PPO | Source: Ambulatory Visit | Attending: Family Medicine | Admitting: Family Medicine

## 2019-09-07 DIAGNOSIS — Z1231 Encounter for screening mammogram for malignant neoplasm of breast: Secondary | ICD-10-CM

## 2020-01-04 ENCOUNTER — Ambulatory Visit: Payer: Self-pay

## 2020-01-04 ENCOUNTER — Telehealth: Payer: Self-pay | Admitting: Medical

## 2020-04-20 ENCOUNTER — Other Ambulatory Visit: Payer: Self-pay | Admitting: Physician Assistant

## 2020-04-20 ENCOUNTER — Other Ambulatory Visit (HOSPITAL_COMMUNITY): Payer: Self-pay | Admitting: Physician Assistant

## 2020-04-20 ENCOUNTER — Other Ambulatory Visit: Payer: Self-pay

## 2020-04-20 ENCOUNTER — Ambulatory Visit
Admission: RE | Admit: 2020-04-20 | Discharge: 2020-04-20 | Disposition: A | Discharge status: Home / Self Care | Payer: BC Managed Care – PPO | Source: Ambulatory Visit | Attending: Physician Assistant | Admitting: Physician Assistant

## 2020-04-20 DIAGNOSIS — R1011 Right upper quadrant pain: Secondary | ICD-10-CM

## 2020-06-08 ENCOUNTER — Other Ambulatory Visit: Payer: Self-pay

## 2020-06-08 ENCOUNTER — Ambulatory Visit: Payer: BC Managed Care – PPO | Admitting: Dermatology

## 2020-06-08 DIAGNOSIS — L719 Rosacea, unspecified: Secondary | ICD-10-CM | POA: Diagnosis not present

## 2020-06-08 DIAGNOSIS — D229 Melanocytic nevi, unspecified: Secondary | ICD-10-CM

## 2020-06-08 DIAGNOSIS — L578 Other skin changes due to chronic exposure to nonionizing radiation: Secondary | ICD-10-CM | POA: Diagnosis not present

## 2020-06-08 DIAGNOSIS — D2371 Other benign neoplasm of skin of right lower limb, including hip: Secondary | ICD-10-CM | POA: Diagnosis not present

## 2020-06-08 DIAGNOSIS — Z1283 Encounter for screening for malignant neoplasm of skin: Secondary | ICD-10-CM | POA: Diagnosis not present

## 2020-06-08 DIAGNOSIS — L814 Other melanin hyperpigmentation: Secondary | ICD-10-CM

## 2020-06-08 DIAGNOSIS — D1800 Hemangioma unspecified site: Secondary | ICD-10-CM

## 2020-06-08 DIAGNOSIS — L821 Other seborrheic keratosis: Secondary | ICD-10-CM

## 2020-06-08 MED ORDER — METRONIDAZOLE 0.75 % EX CREA: TOPICAL_CREAM | Freq: Two times a day (BID) | CUTANEOUS | 11 refills | Status: AC

## 2020-06-08 NOTE — Patient Instructions (Signed)
Melanoma ABCDEs  Melanoma is the most dangerous type of skin cancer, and is the leading cause of death from skin disease.  You are more likely to develop melanoma if you:  Have light-colored skin, light-colored eyes, or red or blond hair  Spend a lot of time in the sun  Tan regularly, either outdoors or in a tanning bed  Have had blistering sunburns, especially during childhood  Have a close family member who has had a melanoma  Have atypical moles or large birthmarks  Early detection of melanoma is key since treatment is typically straightforward and cure rates are extremely high if we catch it early.   The first sign of melanoma is often a change in a mole or a new dark spot.  The ABCDE system is a way of remembering the signs of melanoma.  A for asymmetry:  The two halves do not match. B for border:  The edges of the growth are irregular. C for color:  A mixture of colors are present instead of an even brown color. D for diameter:  Melanomas are usually (but not always) greater than 6mm - the size of a pencil eraser. E for evolution:  The spot keeps changing in size, shape, and color.  Please check your skin once per month between visits. You can use a small mirror in front and a large mirror behind you to keep an eye on the back side or your body.   If you see any new or changing lesions before your next follow-up, please call to schedule a visit.  Please continue daily skin protection including broad spectrum sunscreen SPF 30+ to sun-exposed areas, reapplying every 2 hours as needed when you're outdoors.    Recommend taking Heliocare sun protection supplement daily in sunny weather for additional sun protection. For maximum protection on the sunniest days, you can take up to 2 capsules of regular Heliocare OR take 1 capsule of Heliocare Ultra. For prolonged exposure (such as a full day in the sun), you can repeat your dose of the supplement 4 hours after your first dose.  Heliocare can be purchased at [facility] or at [email or web address].   

## 2020-06-08 NOTE — Progress Notes (Signed)
   New Patient Visit  Subjective  Lynn Leonard is a 55 y.o. female who presents for the following: TBSE (Patient states she is here for a TBSE and first initial visit. Patient denies history and family history of skin cancer. She is concerned about a scaly spot on right leg that she has been there for some time.  ).  Patient here for full body skin exam and skin cancer screening.  Objective  Well appearing patient in no apparent distress; mood and affect are within normal limits.  A full examination was performed including scalp, head, eyes, ears, nose, lips, neck, chest, axillae, abdomen, back, buttocks, bilateral upper extremities, bilateral lower extremities, hands, feet, fingers, toes, fingernails, and toenails. All findings within normal limits unless otherwise noted below.  Objective  face: Mid face erythema with telangiectasias +/- scattered inflammatory papules.   Assessment & Plan    Dermatofibroma Right medial calf - lateral  - Firm pink/brown papulenodule with dimple sign - Benign appearing - Call for any changes  Rosacea face  Chronic condition with duration over one year. Condition is bothersome to patient. Currently flared with mask use.  Denies grittiness in eyes  Discussed BBL treatment  Start metronidazole 0.75 % cream apply topically in morning and at bedtime. Apply to face.  Ordered Medications: metroNIDAZOLE (METROCREAM) 0.75 % cream   Lentigines - Scattered tan macules - Discussed due to sun exposure - Benign, observe - Call for any changes  Seborrheic Keratoses Right lower leg - Stuck-on, waxy, tan-brown papules and plaques  - Discussed benign etiology and prognosis. - Observe - Call for any changes  Melanocytic Nevi - Tan-brown and/or pink-flesh-colored symmetric macules and papules - Benign appearing on exam today - Observation - Call clinic for new or changing moles - Recommend daily use of broad spectrum spf 30+ sunscreen to  sun-exposed areas.   Hemangiomas - Red papules - Discussed benign nature - Observe - Call for any changes  Actinic Damage On chest  - Chronic, secondary to cumulative UV/sun exposure - diffuse scaly erythematous macules with underlying dyspigmentation - Recommend daily broad spectrum sunscreen SPF 30+ to sun-exposed areas, reapply every 2 hours as needed.  - Call for new or changing lesions.  Skin cancer screening performed today.  Return in about 3 months (around 09/05/2020) for follow up on roscea , 1 year tbse .  I, Ruthell Rummage, CMA, am acting as scribe for Forest Gleason, MD.  Documentation: I have reviewed the above documentation for accuracy and completeness, and I agree with the above.  Forest Gleason, MD

## 2020-07-04 ENCOUNTER — Encounter: Payer: Self-pay | Admitting: Dermatology

## 2020-09-14 ENCOUNTER — Ambulatory Visit: Payer: BC Managed Care – PPO | Admitting: Dermatology

## 2020-09-14 ENCOUNTER — Other Ambulatory Visit: Payer: Self-pay

## 2020-09-14 DIAGNOSIS — L719 Rosacea, unspecified: Secondary | ICD-10-CM

## 2020-09-14 DIAGNOSIS — R21 Rash and other nonspecific skin eruption: Secondary | ICD-10-CM

## 2020-09-14 MED ORDER — HALOBETASOL PROPIONATE 0.05 % EX OINT: TOPICAL_OINTMENT | Freq: Two times a day (BID) | CUTANEOUS | 1 refills | Status: AC

## 2020-09-14 NOTE — Patient Instructions (Addendum)
Start halobetasol ointment twice daily for up to 3 weeks then weekends only. Avoid applying to face, groin, and axilla. Use as directed. Risk of skin atrophy with long-term use reviewed.   Topical steroids (such as triamcinolone, fluocinolone, fluocinonide, mometasone, clobetasol, halobetasol, betamethasone, hydrocortisone) can cause thinning and lightening of the skin if they are used for too long in the same area. Your physician has selected the right strength medicine for your problem and area affected on the body. Please use your medication only as directed by your physician to prevent side effects.   Recommend daily broad spectrum sunscreen SPF 30+ to sun-exposed areas, reapply every 2 hours as needed. Call for new or changing lesions.  Staying in the shade or wearing long sleeves, sun glasses (UVA+UVB protection) and wide brim hats (4-inch brim around the entire circumference of the hat) are also recommended for sun protection.   If you have any questions or concerns for your doctor, please call our main line at [phone] and press option 4 to reach your doctor's medical assistant. If no one answers, please leave a voicemail as directed and we will return your call as soon as possible. Messages left after 4 pm will be answered the following business day.   You may also send [place] a message via [place]. We typically respond to MyChart messages within 1-2 business days.  For prescription refills, please ask your pharmacy to contact our office. Our fax number is [phone].  If you have an urgent issue when the clinic is closed that cannot wait until the next business day, you can page your doctor at the number below.    Please note that while we do our best to be available for urgent issues outside of office hours, we are not available 24/7.   If you have an urgent issue and are unable to reach [place], you may choose to seek medical care at your doctor's office, retail clinic, urgent care center,  or emergency room.  If you have a medical emergency, please immediately call 911 or go to the emergency department.  Pager Numbers  - Dr. Nehemiah Massed: [phone]  - Dr. Laurence Ferrari: [phone]  - Dr. Nicole Kindred: [phone]  In the event of inclement weather, please call our main line at [phone] for an update on the status of any delays or closures.  Dermatology Medication Tips: Please keep the boxes that topical medications come in in order to help keep track of the instructions about where and how to use these. Pharmacies typically print the medication instructions only on the boxes and not directly on the medication tubes.   If your medication is too expensive, please contact our office at [phone] option 4 or send [place] a message through [place].   We are unable to tell what your co-pay for medications will be in advance as this is different depending on your insurance coverage. However, we may be able to find a substitute medication at lower cost or fill out paperwork to get insurance to cover a needed medication.   If a prior authorization is required to get your medication covered by your insurance company, please allow [place] 1-2 business days to complete this process.  Drug prices often vary depending on where the prescription is filled and some pharmacies may offer cheaper prices.  The website www.goodrx.com contains coupons for medications through different pharmacies. The prices here do not account for what the cost may be with help from insurance (it may be cheaper with your insurance), but the  website can give you the price if you did not use any insurance.  - You can print the associated coupon and take it with your prescription to the pharmacy.  - You may also stop by our office during regular business hours and pick up a GoodRx coupon card.  - If you need your prescription sent electronically to a different pharmacy, notify our office through [facility] or by phone at  [phone] option 4.

## 2020-09-14 NOTE — Progress Notes (Signed)
   Follow-Up Visit   Subjective  Lynn Leonard is a 55 y.o. female who presents for the following: Follow-up (Patient here today for 3 month rosacea follow up. She is using metronidazole 0.075% cream twice daily and has noticed improvement in bumps. ).  Patient was diagnosed last weekend with cellulitis at lower legs following chigger bites. She was put on prednisone 6 day taper and cephalexin 500mg  x 1 week.  The following portions of the chart were reviewed this encounter and updated as appropriate:   Tobacco  Allergies  Meds  Problems  Med Hx  Surg Hx  Fam Hx      Review of Systems:  No other skin or systemic complaints except as noted in HPI or Assessment and Plan.  Objective  Well appearing patient in no apparent distress; mood and affect are within normal limits.  A focused examination was performed including face, lower legs. Relevant physical exam findings are noted in the Assessment and Plan.  Objective  face: Mid face erythema  Objective  Bilateral Lower Leg: Scaly red plaques bilateral lower legs   Assessment & Plan  Rosacea face  Chronic condition with duration or expected duration over one year. Currently well-controlled.  Rosacea is a chronic progressive skin condition usually affecting the face of adults, causing redness and/or acne bumps. It is treatable but not curable. It sometimes affects the eyes (ocular rosacea) as well. It may respond to topical and/or systemic medication and can flare with stress, sun exposure, alcohol, exercise and some foods.  Daily application of broad spectrum spf 30+ sunscreen to face is recommended to reduce flares.  No grittiness or irritation of the eyes.  Continue metronidazole 0.075% cream 1-2 times daily.   Other Related Medications metroNIDAZOLE (METROCREAM) 0.75 % cream  Rash and other nonspecific skin eruption Bilateral Lower Leg  Favor hypersensitivity to bites  Start halobetasol ointment twice daily for up  to 3 weeks then weekends only. Avoid applying to face, groin, and axilla. Use as directed. Risk of skin atrophy with long-term use reviewed.   Topical steroids (such as triamcinolone, fluocinolone, fluocinonide, mometasone, clobetasol, halobetasol, betamethasone, hydrocortisone) can cause thinning and lightening of the skin if they are used for too long in the same area. Your physician has selected the right strength medicine for your problem and area affected on the body. Please use your medication only as directed by your physician to prevent side effects.   Call if not significantly cleared within 1-2 weeks.  No evidence of cellulitis on exam today.  Ordered Medications: halobetasol (ULTRAVATE) 0.05 % ointment  Return in about 1 year (around 09/14/2021) for Rosacea, TBSE.  Graciella Belton, RMA, am acting as scribe for Forest Gleason, MD .  Documentation: I have reviewed the above documentation for accuracy and completeness, and I agree with the above.  Forest Gleason, MD

## 2020-09-19 ENCOUNTER — Encounter: Payer: Self-pay | Admitting: Dermatology

## 2020-10-27 ENCOUNTER — Other Ambulatory Visit: Payer: Self-pay | Admitting: Family Medicine

## 2020-10-27 DIAGNOSIS — Z1231 Encounter for screening mammogram for malignant neoplasm of breast: Secondary | ICD-10-CM

## 2020-11-08 ENCOUNTER — Other Ambulatory Visit: Payer: Self-pay

## 2020-11-08 ENCOUNTER — Ambulatory Visit
Admission: RE | Admit: 2020-11-08 | Discharge: 2020-11-08 | Disposition: A | Discharge status: Home / Self Care | Payer: BC Managed Care – PPO | Source: Ambulatory Visit | Attending: Family Medicine | Admitting: Family Medicine

## 2020-11-08 DIAGNOSIS — Z1231 Encounter for screening mammogram for malignant neoplasm of breast: Secondary | ICD-10-CM | POA: Insufficient documentation

## 2021-03-27 DIAGNOSIS — K760 Fatty (change of) liver, not elsewhere classified: Secondary | ICD-10-CM | POA: Insufficient documentation

## 2021-09-14 ENCOUNTER — Ambulatory Visit: Payer: BC Managed Care – PPO | Admitting: Dermatology

## 2021-11-15 ENCOUNTER — Encounter: Payer: Self-pay | Admitting: Dermatology

## 2021-11-15 ENCOUNTER — Ambulatory Visit: Payer: BC Managed Care – PPO | Admitting: Dermatology

## 2021-11-15 DIAGNOSIS — D239 Other benign neoplasm of skin, unspecified: Secondary | ICD-10-CM

## 2021-11-15 DIAGNOSIS — D1800 Hemangioma unspecified site: Secondary | ICD-10-CM

## 2021-11-15 DIAGNOSIS — H019 Unspecified inflammation of eyelid: Secondary | ICD-10-CM

## 2021-11-15 DIAGNOSIS — L578 Other skin changes due to chronic exposure to nonionizing radiation: Secondary | ICD-10-CM

## 2021-11-15 DIAGNOSIS — D229 Melanocytic nevi, unspecified: Secondary | ICD-10-CM

## 2021-11-15 DIAGNOSIS — L821 Other seborrheic keratosis: Secondary | ICD-10-CM

## 2021-11-15 DIAGNOSIS — L814 Other melanin hyperpigmentation: Secondary | ICD-10-CM

## 2021-11-15 DIAGNOSIS — L719 Rosacea, unspecified: Secondary | ICD-10-CM

## 2021-11-15 DIAGNOSIS — Z1283 Encounter for screening for malignant neoplasm of skin: Secondary | ICD-10-CM | POA: Diagnosis not present

## 2021-11-15 DIAGNOSIS — H01133 Eczematous dermatitis of right eye, unspecified eyelid: Secondary | ICD-10-CM | POA: Diagnosis not present

## 2021-11-15 DIAGNOSIS — H01136 Eczematous dermatitis of left eye, unspecified eyelid: Secondary | ICD-10-CM | POA: Diagnosis not present

## 2021-11-15 MED ORDER — PIMECROLIMUS 1 % EX CREA: TOPICAL_CREAM | Freq: Two times a day (BID) | CUTANEOUS | 1 refills | Status: AC

## 2021-11-15 NOTE — Patient Instructions (Addendum)
Recommend VaniCream products, Free & Clear detergents  Recommend taking Heliocare sun protection supplement daily in sunny weather for additional sun protection. For maximum protection on the sunniest days, you can take up to 2 capsules of regular Heliocare OR take 1 capsule of Heliocare Ultra. For prolonged exposure (such as a full day in the sun), you can repeat your dose of the supplement 4 hours after your first dose. Heliocare can be purchased at [employer], at some [facility] or at [email or web address].    Melanoma ABCDEs  Melanoma is the most dangerous type of skin cancer, and is the leading cause of death from skin disease.  You are more likely to develop melanoma if you: Have light-colored skin, light-colored eyes, or red or blond hair Spend a lot of time in the sun Tan regularly, either outdoors or in a tanning bed Have had blistering sunburns, especially during childhood Have a close family member who has had a melanoma Have atypical moles or large birthmarks  Early detection of melanoma is key since treatment is typically straightforward and cure rates are extremely high if we catch it early.   The first sign of melanoma is often a change in a mole or a new dark spot.  The ABCDE system is a way of remembering the signs of melanoma.  A for asymmetry:  The two halves do not match. B for border:  The edges of the growth are irregular. C for color:  A mixture of colors are present instead of an even brown color. D for diameter:  Melanomas are usually (but not always) greater than 69m - the size of a pencil eraser. E for evolution:  The spot keeps changing in size, shape, and color.  Please check your skin once per month between visits. You can use a small mirror in front and a large mirror behind you to keep an eye on the back side or your body.   If you see any new or changing lesions before your next follow-up, please call to schedule a visit.  Please continue daily skin  protection including broad spectrum sunscreen SPF 30+ to sun-exposed areas, reapplying every 2 hours as needed when you're outdoors.    Due to recent changes in healthcare laws, you may see results of your pathology and/or laboratory studies on MyChart before the doctors have had a chance to review them. We understand that in some cases there may be results that are confusing or concerning to you. Please understand that not all results are received at the same time and often the doctors may need to interpret multiple results in order to provide you with the best plan of care or course of treatment. Therefore, we ask that you please give u[place]2 business days to thoroughly review all your results before contacting the office for clarification. Should we see a critical lab result, you will be contacted sooner.   If You Need Anything After Your Visit  If you have any questions or concerns for your doctor, please call our main line at [phone] press option 4 to reach your doctor's medical assistant. If no one answers, please leave a voicemail as directed and we will return your call as soon as possible. Messages left after 4 pm will be answered the following business day.   You may also send [place] message via [place] We typically respond to MyChart messages within 1-2 business days.  For prescription refills, please ask your pharmacy to contact our office. Our  fax number is [phone].  If you have an urgent issue when the clinic is closed that cannot wait until the next business day, you can page your doctor at the number below.    Please note that while we do our best to be available for urgent issues outside of office hours, we are not available 24/7.   If you have an urgent issue and are unable to reach [place], you may choose to seek medical care at your doctor's office, retail clinic, urgent care center, or emergency room.  If you have a medical emergency, please immediately call 911 or go to  the emergency department.  Pager Numbers  - Dr. Nehemiah Massed: [phone]  - Dr. Laurence Ferrari: [phone]  - Dr. Nicole Kindred: [phone]  In the event of inclement weather, please call our main line at [phone] for an update on the status of any delays or closures.  Dermatology Medication Tips: Please keep the boxes that topical medications come in in order to help keep track of the instructions about where and how to use these. Pharmacies typically print the medication instructions only on the boxes and not directly on the medication tubes.   If your medication is too expensive, please contact our office at [phone] option 4 or send [place] a message through [place].   We are unable to tell what your co-pay for medications will be in advance as this is different depending on your insurance coverage. However, we may be able to find a substitute medication at lower cost or fill out paperwork to get insurance to cover a needed medication.   If a prior authorization is required to get your medication covered by your insurance company, please allow [place] 1-2 business days to complete this process.  Drug prices often vary depending on where the prescription is filled and some pharmacies may offer cheaper prices.  The website www.goodrx.com contains coupons for medications through different pharmacies. The prices here do not account for what the cost may be with help from insurance (it may be cheaper with your insurance), but the website can give you the price if you did not use any insurance.  - You can print the associated coupon and take it with your prescription to the pharmacy.  - You may also stop by our office during regular business hours and pick up a GoodRx coupon card.  - If you need your prescription sent electronically to a different pharmacy, notify our office through [facility] or by phone at [phone] option 4.     Si Usted Necesita Algo Despus de Su Visita  Tambin puede  enviarnos un mensaje a travs de Pharmacist, community. Por lo general respondemos a los mensajes de MyChart en el transcurso de 1 a 2 das hbiles.  Para renovar recetas, por favor pida a su farmacia que se ponga en contacto con nuestra oficina. Harland Dingwall de fax es [facility] [phone].  Si tiene un asunto urgente cuando la clnica est cerrada y que no puede esperar hasta el siguiente da hbil, puede llamar/localizar a su doctor(a) al nmero que aparece a continuacin.   Por favor, tenga en cuenta que aunque hacemos todo lo posible para estar disponibles para asuntos urgentes fuera del horario de [facility], no estamos disponibles las 24 horas del da, los 7 das de la [place].   Si tiene un problema urgente y no puede comunicarse con nosotros, puede optar por buscar atencin mdica  en el consultorio de su doctor(a), en una clnica privada, en un centro  de atencin urgente o en una sala de emergencias.  Si tiene Engineering geologist, por favor llame inmediatamente al 911 o vaya a la sala de emergencias.  Nmeros de bper  - Dr. Nehemiah Massed: [phone]  - Dra. Moye: [phone]  - Dra. Nicole Kindred: [phone]  En caso de inclemencias del [place], por favor llame a Johnsie Kindred principal al [phone] para una actualizacin sobre el [place] de cualquier retraso o cierre.  Consejos para la medicacin en dermatologa: Por favor, guarde las cajas en las que vienen los medicamentos de uso tpico para ayudarle a seguir las instrucciones sobre dnde y cmo usarlos. Las farmacias generalmente imprimen las instrucciones del medicamento slo en las cajas y no directamente en los tubos del [place].   Si su medicamento es muy caro, por favor, pngase en contacto con Zigmund Daniel llamando al [phone] y presione la opcin 4 o envenos un mensaje a travs de Pharmacist, community.   No podemos decirle cul ser su copago por los medicamentos por adelantado ya que esto es diferente dependiendo de la cobertura de su seguro.  Sin embargo, es posible que podamos encontrar un medicamento sustituto a Electrical engineer un formulario para que el seguro cubra el medicamento que se considera necesario.   Si se requiere una autorizacin previa para que su compaa de seguros [place] su medicamento, por favor permtanos de 1 a 2 das hbiles para completar este proceso.  Los precios de los medicamentos varan con frecuencia dependiendo del Environmental consultant de dnde se surte la receta y alguna farmacias pueden ofrecer precios ms baratos.  El sitio web www.goodrx.com tiene cupones para medicamentos de Airline pilot. Los precios aqu no tienen en cuenta lo que podra costar con la ayuda del seguro (puede ser ms barato con su seguro), pero el sitio web puede darle el precio si no utiliz Research scientist (physical sciences).  - Puede imprimir el cupn correspondiente y llevarlo con su receta a la farmacia.  - Tambin puede pasar por nuestra oficina durante el horario de atencin regular y Charity fundraiser una tarjeta de cupones de GoodRx.  - Si necesita que su receta se enve electrnicamente a una farmacia diferente, informe a nuestra oficina a travs de MyChart de [place] o por telfono llamando al [phone] y presione la opcin 4.

## 2021-11-15 NOTE — Progress Notes (Signed)
Follow-Up Visit   Subjective  Lynn Leonard is a 56 y.o. female who presents for the following: Annual Exam (The patient presents for Total-Body Skin Exam (TBSE) for skin cancer screening and mole check.  The patient has spots, moles and lesions to be evaluated, some may be new or changing and the patient has concerns that these could be cancer. Patient with no hx of skin cancer. ), Rosacea (Patient with hx of rosacea. She had metronidazole 0.75 % cream but has not been using it consistently. Patient feels rosacea is stable. ), and Rash (Patient with peeling, itching and redness at eyelids. She is only using Cetaphil and micellar water, no make up or other products, nothing new. Present for a few months. ).  Patient does have allergies. She feels rash at eyes may be worse with stress.   The following portions of the chart were reviewed this encounter and updated as appropriate:   Tobacco  Allergies  Meds  Problems  Med Hx  Surg Hx  Fam Hx      Review of Systems:  No other skin or systemic complaints except as noted in HPI or Assessment and Plan.  Objective  Well appearing patient in no apparent distress; mood and affect are within normal limits.  A full examination was performed including scalp, head, eyes, ears, nose, lips, neck, chest, axillae, abdomen, back, buttocks, bilateral upper extremities, bilateral lower extremities, hands, feet, fingers, toes, fingernails, and toenails. All findings within normal limits unless otherwise noted below.  B/L eyelids Scaly pink plaques bilateral upper and lower eyelids  Head - Anterior (Face), Nose Mid-face erythema and telangiectasias    Assessment & Plan  Dermatitis of eyelids of both eyes, unspecified type B/L eyelids  Ddx irritant or allergic contact dermatitis > psoriasis. No other skin findings or history of proximal muscle weakness to suggest dermatomyositis.   Recommend VaniCream, Free & Clear detergents.  Start Elidel  twice daily to eyelids as needed for rash.  Reviewed risk of temporary burning with Elidel. May use OTC HC twice a day for 1-3 days for short period of time.   pimecrolimus (ELIDEL) 1 % cream - B/L eyelids Apply topically 2 (two) times daily. To eyelids as needed for rash.  Rosacea Head - Anterior (Face); Nose  Chronic condition with duration or expected duration over one year. Currently well-controlled.  Rosacea is a chronic progressive skin condition usually affecting the face of adults, causing redness and/or acne bumps. It is treatable but not curable. It sometimes affects the eyes (ocular rosacea) as well. It may respond to topical and/or systemic medication and can flare with stress, sun exposure, alcohol, exercise and some foods.  Daily application of broad spectrum spf 30+ sunscreen to face is recommended to reduce flares.  Continue metronidazole cream twice a day as needed     Lentigines - Scattered tan macules - Due to sun exposure - Benign-appearing, observe - Recommend daily broad spectrum sunscreen SPF 30+ to sun-exposed areas, reapply every 2 hours as needed. - Call for any changes  Seborrheic Keratoses - Stuck-on, waxy, tan-brown papules and/or plaques  - Benign-appearing - Discussed benign etiology and prognosis. - Observe - Call for any changes  Melanocytic Nevi - Tan-brown and/or pink-flesh-colored symmetric macules and papules - Benign appearing on exam today - Observation - Call clinic for new or changing moles - Recommend daily use of broad spectrum spf 30+ sunscreen to sun-exposed areas.   Hemangiomas - Red papules - Discussed benign nature -  Observe - Call for any changes  Actinic Damage - Chronic condition, secondary to cumulative UV/sun exposure - diffuse scaly erythematous macules with underlying dyspigmentation - Recommend daily broad spectrum sunscreen SPF 30+ to sun-exposed areas, reapply every 2 hours as needed.  - Staying in the shade or  wearing long sleeves, sun glasses (UVA+UVB protection) and wide brim hats (4-inch brim around the entire circumference of the hat) are also recommended for sun protection.  - Call for new or changing lesions.  Dermatofibroma - Firm pink/brown papulenodule with dimple sign - Benign appearing - Call for any changes  Skin cancer screening performed today.  Return today (on 11/15/2021) for 6 wk - 4 month to check dermatitis.  Graciella Belton, RMA, am acting as scribe for Forest Gleason, MD .  Documentation: I have reviewed the above documentation for accuracy and completeness, and I agree with the above.  Forest Gleason, MD

## 2022-01-30 DIAGNOSIS — Z862 Personal history of diseases of the blood and blood-forming organs and certain disorders involving the immune mechanism: Secondary | ICD-10-CM | POA: Insufficient documentation

## 2022-02-27 ENCOUNTER — Ambulatory Visit
Admission: RE | Admit: 2022-02-27 | Discharge: 2022-02-27 | Disposition: A | Discharge status: Home / Self Care | Payer: BC Managed Care – PPO | Source: Ambulatory Visit | Attending: Physician Assistant | Admitting: Physician Assistant

## 2022-02-27 ENCOUNTER — Other Ambulatory Visit: Payer: Self-pay | Admitting: Physician Assistant

## 2022-02-27 DIAGNOSIS — M25561 Pain in right knee: Secondary | ICD-10-CM | POA: Diagnosis present

## 2022-02-27 DIAGNOSIS — M79604 Pain in right leg: Secondary | ICD-10-CM | POA: Insufficient documentation

## 2022-03-08 ENCOUNTER — Ambulatory Visit: Payer: BC Managed Care – PPO | Admitting: Dermatology

## 2022-03-15 ENCOUNTER — Ambulatory Visit: Payer: BC Managed Care – PPO | Admitting: Dermatology

## 2022-03-15 DIAGNOSIS — L719 Rosacea, unspecified: Secondary | ICD-10-CM

## 2022-03-15 DIAGNOSIS — L309 Dermatitis, unspecified: Secondary | ICD-10-CM | POA: Diagnosis not present

## 2022-03-15 NOTE — Patient Instructions (Addendum)
Recommend ketotifen eye drops  Continue Elidel twice daily as needed   Due to recent changes in healthcare laws, you may see results of your pathology and/or laboratory studies on MyChart before the doctors have had a chance to review them. We understand that in some cases there may be results that are confusing or concerning to you. Please understand that not all results are received at the same time and often the doctors may need to interpret multiple results in order to provide you with the best plan of care or course of treatment. Therefore, we ask that you please give [place] 2 business days to thoroughly review all your results before contacting the office for clarification. Should we see a critical lab result, you will be contacted sooner.   If You Need Anything After Your Visit  If you have any questions or concerns for your doctor, please call our main line at [phone] and press option 4 to reach your doctor's medical assistant. If no one answers, please leave a voicemail as directed and we will return your call as soon as possible. Messages left after 4 pm will be answered the following business day.   You may also send [place] a message via [place]. We typically respond to MyChart messages within 1-2 business days.  For prescription refills, please ask your pharmacy to contact our office. Our fax number is [phone].  If you have an urgent issue when the clinic is closed that cannot wait until the next business day, you can page your doctor at the number below.    Please note that while we do our best to be available for urgent issues outside of office hours, we are not available 24/7.   If you have an urgent issue and are unable to reach [place], you may choose to seek medical care at your doctor's office, retail clinic, urgent care center, or emergency room.  If you have a medical emergency, please immediately call 911 or go to the emergency department.  Pager Numbers  - Dr. Nehemiah Massed:  [phone]  - Dr. Laurence Ferrari: [phone]  - Dr. Nicole Kindred: [phone]  In the event of inclement weather, please call our main line at [phone] for an update on the status of any delays or closures.  Dermatology Medication Tips: Please keep the boxes that topical medications come in in order to help keep track of the instructions about where and how to use these. Pharmacies typically print the medication instructions only on the boxes and not directly on the medication tubes.   If your medication is too expensive, please contact our office at [phone] option 4 or send [place] a message through [place].   We are unable to tell what your co-pay for medications will be in advance as this is different depending on your insurance coverage. However, we may be able to find a substitute medication at lower cost or fill out paperwork to get insurance to cover a needed medication.   If a prior authorization is required to get your medication covered by your insurance company, please allow [place] 1-2 business days to complete this process.  Drug prices often vary depending on where the prescription is filled and some pharmacies may offer cheaper prices.  The website www.goodrx.com contains coupons for medications through different pharmacies. The prices here do not account for what the cost may be with help from insurance (it may be cheaper with your insurance), but the website can give you the price if you did not use any  insurance.  - You can print the associated coupon and take it with your prescription to the pharmacy.  - You may also stop by our office during regular business hours and pick up a GoodRx coupon card.  - If you need your prescription sent electronically to a different pharmacy, notify our office through [facility] or by phone at [phone] option 4.     Si Usted Necesita Algo Despus de Su Visita  Tambin puede enviarnos un mensaje a travs de Pharmacist, community. Por lo general  respondemos a los mensajes de MyChart en el transcurso de 1 a 2 das hbiles.  Para renovar recetas, por favor pida a su farmacia que se ponga en contacto con nuestra oficina. Harland Dingwall de fax es [place] [phone].  Si tiene un asunto urgente cuando la clnica est cerrada y que no puede esperar hasta el siguiente da hbil, puede llamar/localizar a su doctor(a) al nmero que aparece a continuacin.   Por favor, tenga en cuenta que aunque hacemos todo lo posible para estar disponibles para asuntos urgentes fuera del horario de [place], no estamos disponibles las 24 horas del da, los 7 das de la [place].   Si tiene un problema urgente y no puede comunicarse con nosotros, puede optar por buscar atencin mdica  en el consultorio de su doctor(a), en una clnica privada, en un centro de atencin urgente o en una sala de emergencias.  Si tiene Engineering geologist, por favor llame inmediatamente al 911 o vaya a la sala de emergencias.  Nmeros de bper  - Dr. Nehemiah Massed: [phone]  - Dra. Moye: [phone]  - Dra. Nicole Kindred: [phone]  En caso de inclemencias del [place], por favor llame a Johnsie Kindred principal al [phone] para una actualizacin sobre el [place] de cualquier retraso o cierre.  Consejos para la medicacin en dermatologa: Por favor, guarde las cajas en las que vienen los medicamentos de uso tpico para ayudarle a seguir las instrucciones sobre dnde y cmo usarlos. Las farmacias generalmente imprimen las instrucciones del medicamento slo en las cajas y no directamente en los tubos del [place].   Si su medicamento es muy caro, por favor, pngase en contacto con Zigmund Daniel llamando al [phone] y presione la opcin 4 o envenos un mensaje a travs de Pharmacist, community.   No podemos decirle cul ser su copago por los medicamentos por adelantado ya que esto es diferente dependiendo de la cobertura de su seguro. Sin embargo, es posible que podamos encontrar un  medicamento sustituto a Electrical engineer un formulario para que el seguro cubra el medicamento que se considera necesario.   Si se requiere una autorizacin previa para que su compaa de seguros [place] su medicamento, por favor permtanos de 1 a 2 das hbiles para completar este proceso.  Los precios de los medicamentos varan con frecuencia dependiendo del Environmental consultant de dnde se surte la receta y alguna farmacias pueden ofrecer precios ms baratos.  El sitio web www.goodrx.com tiene cupones para medicamentos de Airline pilot. Los precios aqu no tienen en cuenta lo que podra costar con la ayuda del seguro (puede ser ms barato con su seguro), pero el sitio web puede darle el precio si no utiliz Research scientist (physical sciences).  - Puede imprimir el cupn correspondiente y llevarlo con su receta a la farmacia.  - Tambin puede pasar por nuestra oficina durante el horario de atencin regular y Charity fundraiser una tarjeta de cupones de GoodRx.  - Si necesita que su receta se enve electrnicamente a Ardelia Mems  farmacia diferente, informe a nuestra oficina a travs de MyChart de [place] o por telfono llamando al [phone] y presione la opcin 4.

## 2022-03-15 NOTE — Progress Notes (Signed)
   Follow-Up Visit   Subjective  Lynn Leonard is a 55 y.o. female who presents for the following: Dermatitis (Patient here to follow up on eyelid dermatitis, using Elidel twice daily as needed. ) and Rosacea (Not bothersome for patient, no bumps. Patient has metronidazole but not using. ).   The following portions of the chart were reviewed this encounter and updated as appropriate:   Tobacco  Allergies  Meds  Problems  Med Hx  Surg Hx  Fam Hx      Review of Systems:  No other skin or systemic complaints except as noted in HPI or Assessment and Plan.  Objective  Well appearing patient in no apparent distress; mood and affect are within normal limits.  A focused examination was performed including face. Relevant physical exam findings are noted in the Assessment and Plan.  face Mid face erythema with telangiectasias  B/L eyelids Erythematous patches at upper eyelids    Assessment & Plan  Rosacea face  Rosacea is a chronic progressive skin condition usually affecting the face of adults, causing redness and/or acne bumps. It is treatable but not curable. It sometimes affects the eyes (ocular rosacea) as well. It may respond to topical and/or systemic medication and can flare with stress, sun exposure, alcohol, exercise, topical steroids (including hydrocortisone/cortisone 10) and some foods.  Daily application of broad spectrum spf 30+ sunscreen to face is recommended to reduce flares.  Discussed the treatment option of BBL/laser.  Typically we recommend 1-3 treatment sessions about 5-8 weeks apart for best results.  The patient's condition may require "maintenance treatments" in the future.  The fee for BBL / laser treatments is $350 per treatment session for the whole face.  A fee can be quoted for other parts of the body. Insurance typically does not pay for BBL/laser treatments and therefore the fee is an out-of-pocket cost.  Patient deferred treatment at this  time.   Dermatitis B/L eyelids  Chronic condition with duration or expected duration over one year. Currently well-controlled.  Recommend ketotifen eye drops twice a day as needed for itch of eyes  Discussed patch testing in future if continues to be persistent  Continue pimecrolimus twice daily as needed   Return in about 1 year (around 03/16/2023) for Dermatitis, Rosacea.  Graciella Belton, RMA, am acting as scribe for Forest Gleason, MD .  Documentation: I have reviewed the above documentation for accuracy and completeness, and I agree with the above.  Forest Gleason, MD

## 2022-03-19 ENCOUNTER — Encounter: Payer: Self-pay | Admitting: Dermatology

## 2022-06-11 ENCOUNTER — Other Ambulatory Visit: Payer: Self-pay | Admitting: Obstetrics and Gynecology

## 2022-06-11 DIAGNOSIS — Z1231 Encounter for screening mammogram for malignant neoplasm of breast: Secondary | ICD-10-CM

## 2022-06-27 ENCOUNTER — Ambulatory Visit
Admission: RE | Admit: 2022-06-27 | Discharge: 2022-06-27 | Disposition: A | Discharge status: Home / Self Care | Payer: BC Managed Care – PPO | Source: Ambulatory Visit | Attending: Obstetrics and Gynecology | Admitting: Obstetrics and Gynecology

## 2022-06-27 DIAGNOSIS — Z1231 Encounter for screening mammogram for malignant neoplasm of breast: Secondary | ICD-10-CM

## 2022-10-08 IMAGING — MG MM DIGITAL SCREENING BILAT W/ TOMO AND CAD
8 series · 8 of 24 positions shown · non-contrast
Comparison: Previous exam(s).

CLINICAL DATA: Screening.

EXAM:
DIGITAL SCREENING BILATERAL MAMMOGRAM WITH TOMOSYNTHESIS AND CAD
TECHNIQUE: Bilateral screening digital craniocaudal and mediolateral oblique
mammograms were obtained. Bilateral screening digital breast
tomosynthesis was performed. The images were evaluated with
computer-aided detection.

[R CC synth-2D]
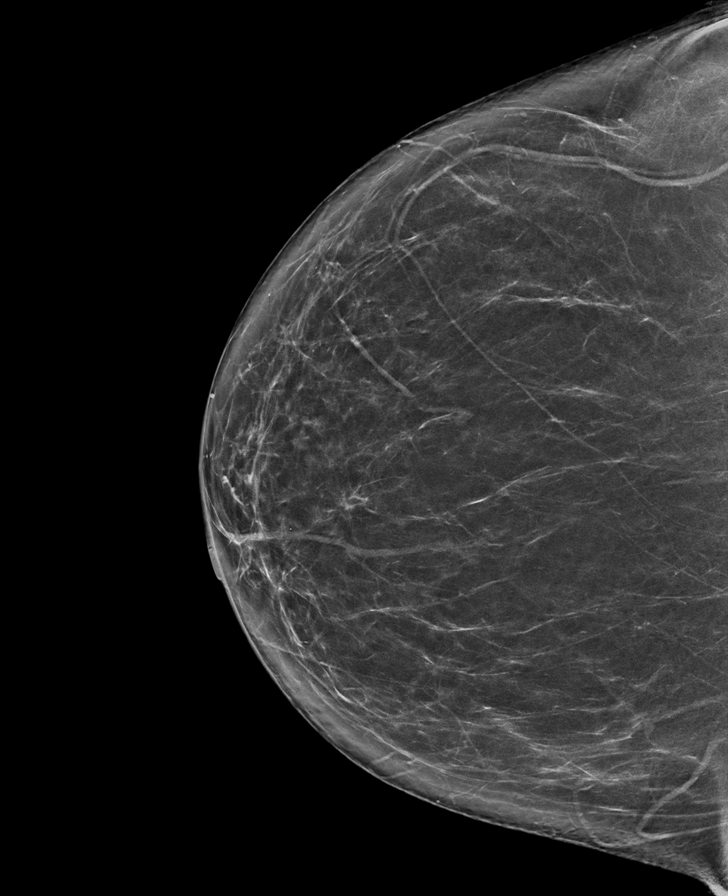

[L CC synth-2D]
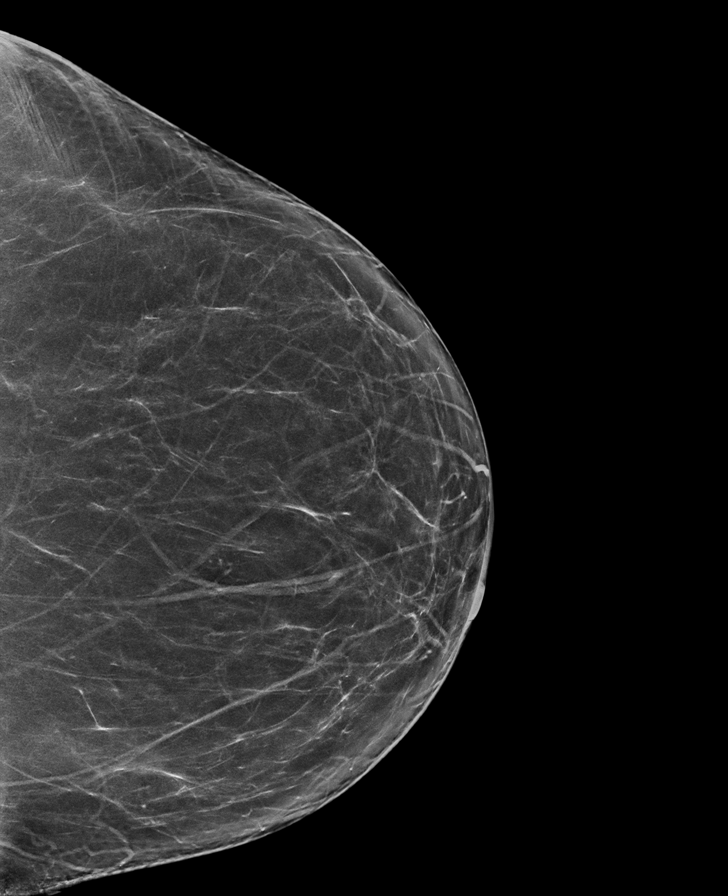

[L MLO synth-2D]
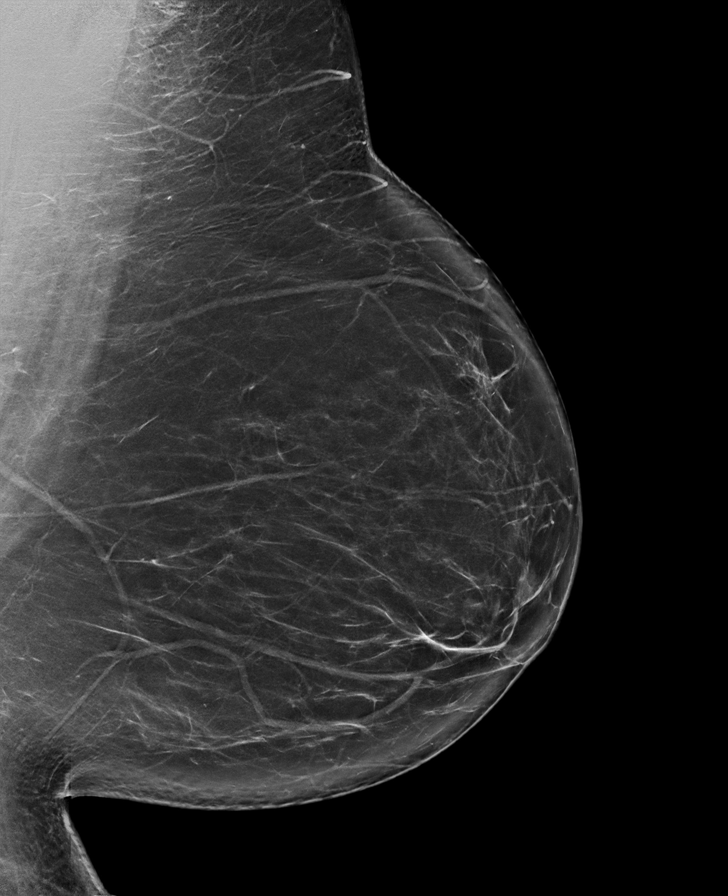

[R MLO synth-2D]
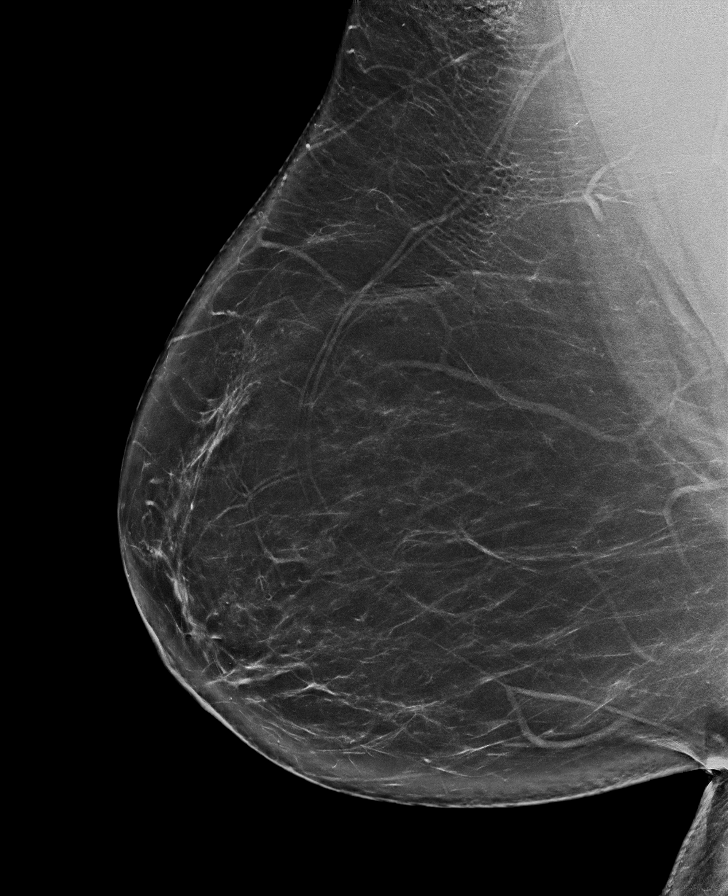

[R MLO tomo · tomo slice 49/96.0]
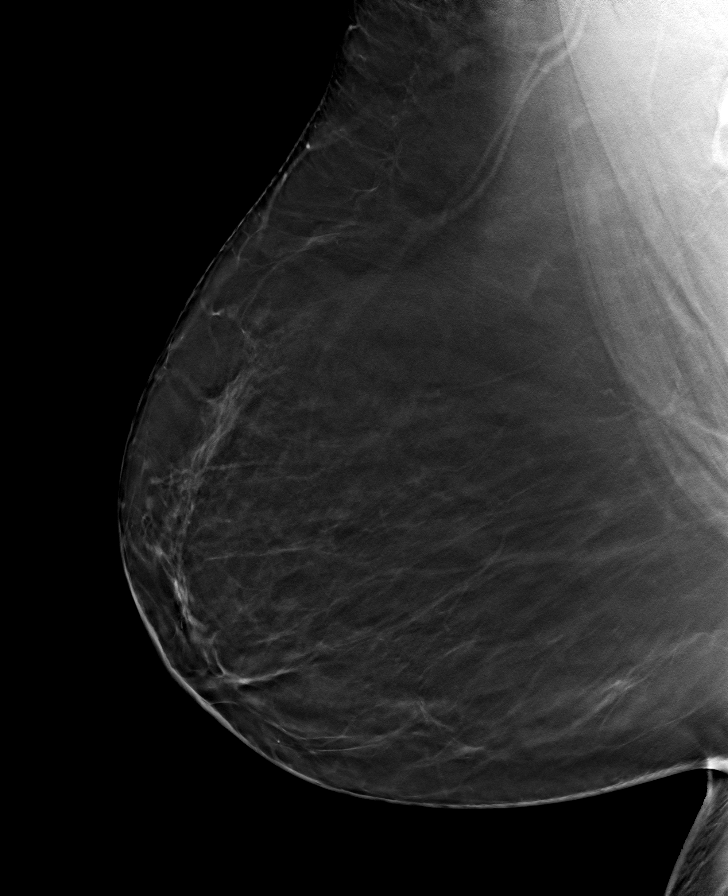

[L CC tomo · tomo slice 39/76.0]
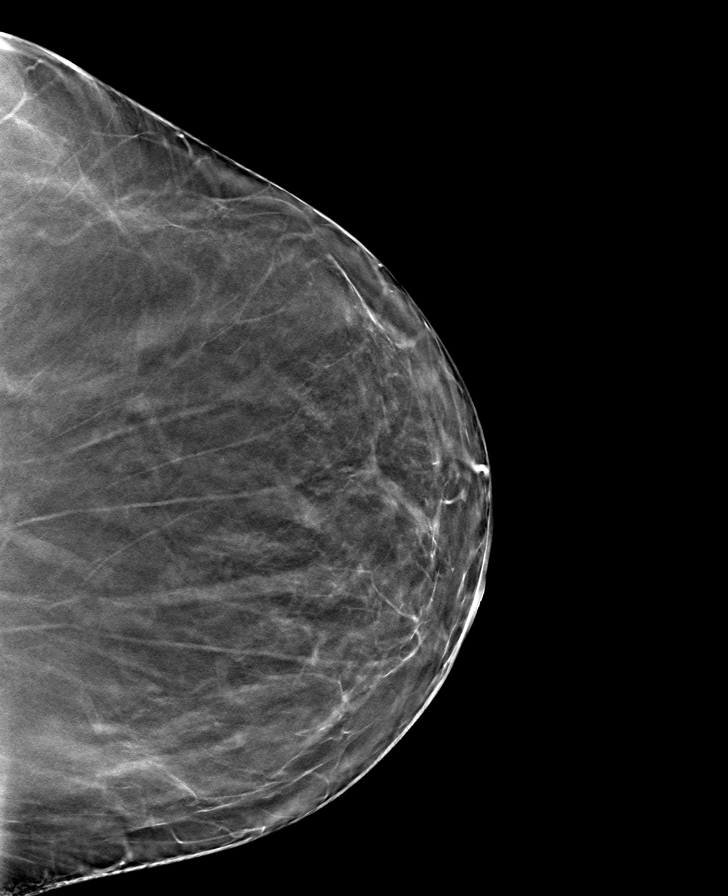

[R CC tomo · tomo slice 41/81.0]
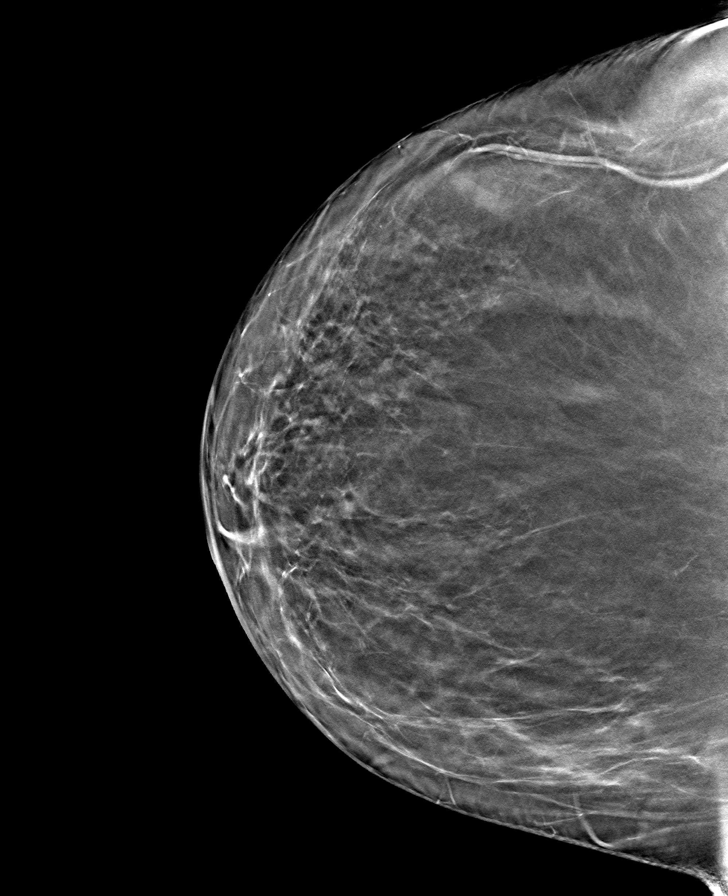

[L MLO tomo · tomo slice 48/95.0]
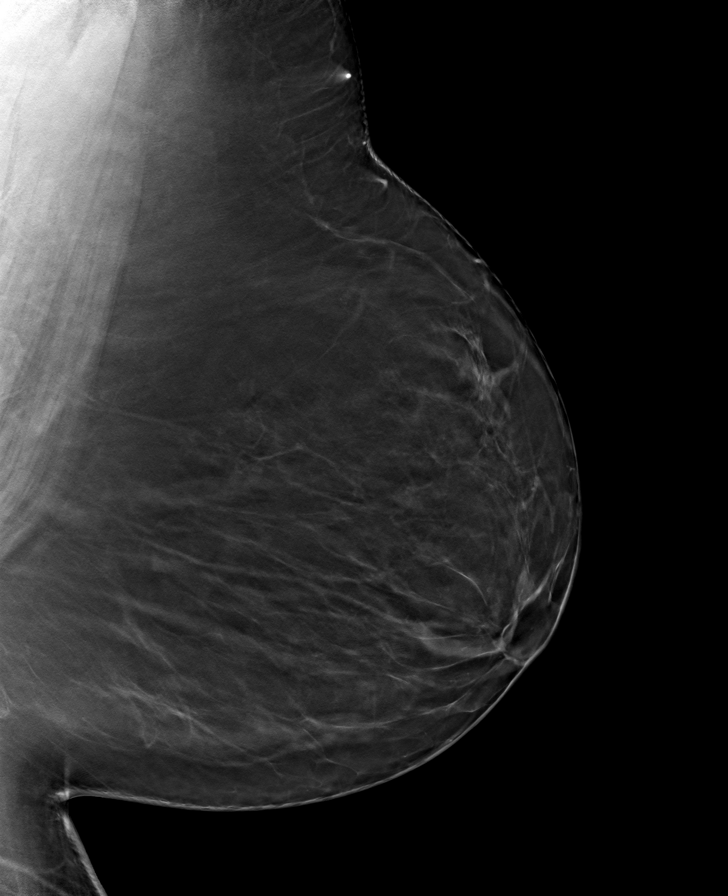

[8 of 24 positions shown; findings below may reference images not displayed]

ACR Breast Density Category b: There are scattered areas of
fibroglandular density.
FINDINGS: There are no findings suspicious for malignancy.
IMPRESSION: No mammographic evidence of malignancy. A result letter of this
screening mammogram will be mailed directly to the patient.

RECOMMENDATION:
Screening mammogram in one year. (Code:51-O-LD2)

BI-RADS CATEGORY  1: Negative.

## 2022-12-28 DIAGNOSIS — J342 Deviated nasal septum: Secondary | ICD-10-CM | POA: Insufficient documentation

## 2022-12-28 DIAGNOSIS — J343 Hypertrophy of nasal turbinates: Secondary | ICD-10-CM | POA: Insufficient documentation

## 2022-12-28 DIAGNOSIS — G4733 Obstructive sleep apnea (adult) (pediatric): Secondary | ICD-10-CM | POA: Insufficient documentation

## 2023-03-18 ENCOUNTER — Encounter: Payer: Self-pay | Admitting: Dermatology

## 2023-03-18 ENCOUNTER — Ambulatory Visit (INDEPENDENT_AMBULATORY_CARE_PROVIDER_SITE_OTHER): Payer: BC Managed Care – PPO | Admitting: Dermatology

## 2023-03-18 DIAGNOSIS — Z6841 Body Mass Index (BMI) 40.0 and over, adult: Secondary | ICD-10-CM | POA: Insufficient documentation

## 2023-03-18 DIAGNOSIS — F330 Major depressive disorder, recurrent, mild: Secondary | ICD-10-CM | POA: Insufficient documentation

## 2023-03-18 DIAGNOSIS — L820 Inflamed seborrheic keratosis: Secondary | ICD-10-CM

## 2023-03-18 NOTE — Patient Instructions (Signed)

## 2023-03-18 NOTE — Progress Notes (Signed)
   Follow-Up Visit   Subjective  Lynn Leonard is a 57 y.o. female who presents for the following: 1 year dermatitis follow up - eyelids, doing well, no longer having to use Elidel.   Pt does have a few spots that she is concerned about on the legs and would like checked today.   The following portions of the chart were reviewed this encounter and updated as appropriate: medications, allergies, medical history  Review of Systems:  No other skin or systemic complaints except as noted in HPI or Assessment and Plan.  Objective  Well appearing patient in no apparent distress; mood and affect are within normal limits.   A focused examination was performed of the following areas: the face   Relevant exam findings are noted in the Assessment and Plan.    Assessment & Plan   SEBORRHEIC KERATOSIS - legs - Stuck-on, waxy, tan-brown papules and/or plaques. Some irritated and inflamed. Offered cryotherapy. Patient defers for now but may schedule for this later - Benign-appearing - Discussed benign etiology and prognosis. - Observe - Call for any changes - May RTC if lesions become itchy or irritated.    Return if symptoms worsen or fail to improve.  Maylene Roes, CMA, am acting as scribe for Elie Goody, MD .   Documentation: I have reviewed the above documentation for accuracy and completeness, and I agree with the above.  Elie Goody, MD

## 2023-03-20 ENCOUNTER — Ambulatory Visit: Payer: BC Managed Care – PPO | Admitting: Dermatology

## 2023-06-03 ENCOUNTER — Other Ambulatory Visit: Payer: Self-pay | Admitting: Obstetrics and Gynecology

## 2023-06-03 DIAGNOSIS — Z1231 Encounter for screening mammogram for malignant neoplasm of breast: Secondary | ICD-10-CM

## 2023-07-04 ENCOUNTER — Ambulatory Visit
Admission: RE | Admit: 2023-07-04 | Discharge: 2023-07-04 | Disposition: A | Discharge status: Home / Self Care | Payer: BC Managed Care – PPO | Source: Ambulatory Visit | Attending: Obstetrics and Gynecology | Admitting: Obstetrics and Gynecology

## 2023-07-04 DIAGNOSIS — Z1231 Encounter for screening mammogram for malignant neoplasm of breast: Secondary | ICD-10-CM | POA: Diagnosis present

## 2023-12-30 ENCOUNTER — Ambulatory Visit: Admitting: Certified Registered Nurse Anesthetist

## 2023-12-30 ENCOUNTER — Other Ambulatory Visit: Payer: Self-pay

## 2023-12-30 ENCOUNTER — Encounter
Admission: RE | Disposition: A | Discharge status: Home / Self Care | Payer: Self-pay | Source: Home / Self Care | Attending: Gastroenterology

## 2023-12-30 ENCOUNTER — Encounter: Payer: Self-pay | Admitting: Gastroenterology

## 2023-12-30 ENCOUNTER — Ambulatory Visit
Admission: RE | Admit: 2023-12-30 | Discharge: 2023-12-30 | Disposition: A | Discharge status: Home / Self Care | Attending: Gastroenterology | Admitting: Gastroenterology

## 2023-12-30 DIAGNOSIS — D123 Benign neoplasm of transverse colon: Secondary | ICD-10-CM | POA: Insufficient documentation

## 2023-12-30 DIAGNOSIS — D124 Benign neoplasm of descending colon: Secondary | ICD-10-CM | POA: Insufficient documentation

## 2023-12-30 DIAGNOSIS — Z860101 Personal history of adenomatous and serrated colon polyps: Secondary | ICD-10-CM | POA: Diagnosis present

## 2023-12-30 DIAGNOSIS — Z83719 Family history of colon polyps, unspecified: Secondary | ICD-10-CM | POA: Diagnosis not present

## 2023-12-30 DIAGNOSIS — G4730 Sleep apnea, unspecified: Secondary | ICD-10-CM | POA: Insufficient documentation

## 2023-12-30 DIAGNOSIS — F419 Anxiety disorder, unspecified: Secondary | ICD-10-CM | POA: Diagnosis not present

## 2023-12-30 DIAGNOSIS — Z6841 Body Mass Index (BMI) 40.0 and over, adult: Secondary | ICD-10-CM | POA: Diagnosis not present

## 2023-12-30 DIAGNOSIS — E063 Autoimmune thyroiditis: Secondary | ICD-10-CM | POA: Insufficient documentation

## 2023-12-30 DIAGNOSIS — Z87891 Personal history of nicotine dependence: Secondary | ICD-10-CM | POA: Diagnosis not present

## 2023-12-30 DIAGNOSIS — Z1211 Encounter for screening for malignant neoplasm of colon: Secondary | ICD-10-CM | POA: Diagnosis not present

## 2023-12-30 DIAGNOSIS — E119 Type 2 diabetes mellitus without complications: Secondary | ICD-10-CM | POA: Insufficient documentation

## 2023-12-30 DIAGNOSIS — K5730 Diverticulosis of large intestine without perforation or abscess without bleeding: Secondary | ICD-10-CM | POA: Diagnosis not present

## 2023-12-30 DIAGNOSIS — Z7985 Long-term (current) use of injectable non-insulin antidiabetic drugs: Secondary | ICD-10-CM | POA: Insufficient documentation

## 2023-12-30 DIAGNOSIS — E6689 Other obesity not elsewhere classified: Secondary | ICD-10-CM | POA: Insufficient documentation

## 2023-12-30 DIAGNOSIS — Z7984 Long term (current) use of oral hypoglycemic drugs: Secondary | ICD-10-CM | POA: Insufficient documentation

## 2023-12-30 HISTORY — DX: Sleep apnea, unspecified: G47.30

## 2023-12-30 HISTORY — PX: COLONOSCOPY: SHX5424

## 2023-12-30 HISTORY — PX: POLYPECTOMY: SHX149

## 2023-12-30 LAB — GLUCOSE, CAPILLARY: Glucose-Capillary: 113 mg/dL — ABNORMAL HIGH (ref 70–99)

## 2023-12-30 SURGERY — COLONOSCOPY
Anesthesia: General

## 2023-12-30 MED ORDER — SODIUM CHLORIDE 0.9 % IV SOLN: INTRAVENOUS | Status: DC

## 2023-12-30 MED ORDER — PROPOFOL 10 MG/ML IV BOLUS
INTRAVENOUS | Status: DC | PRN
  Administered 2023-12-30: 70 mg via INTRAVENOUS
  Administered 2023-12-30: 30 mg via INTRAVENOUS
  Administered 2023-12-30: 125 ug/kg/min via INTRAVENOUS

## 2023-12-30 MED ORDER — DEXMEDETOMIDINE HCL IN NACL 80 MCG/20ML IV SOLN
INTRAVENOUS | Status: DC | PRN
  Administered 2023-12-30: 4 ug via INTRAVENOUS
  Administered 2023-12-30: 8 ug via INTRAVENOUS

## 2023-12-30 MED ORDER — PHENYLEPHRINE 80 MCG/ML (10ML) SYRINGE FOR IV PUSH (FOR BLOOD PRESSURE SUPPORT)
PREFILLED_SYRINGE | INTRAVENOUS | Status: DC | PRN
  Administered 2023-12-30: 160 ug via INTRAVENOUS
  Administered 2023-12-30: 80 ug via INTRAVENOUS

## 2023-12-30 MED ORDER — LIDOCAINE HCL (CARDIAC) PF 100 MG/5ML IV SOSY
PREFILLED_SYRINGE | INTRAVENOUS | Status: DC | PRN
  Administered 2023-12-30: 50 mg via INTRAVENOUS

## 2023-12-30 NOTE — Anesthesia Postprocedure Evaluation (Signed)
 Anesthesia Post Note  Patient: Lynn Leonard  Procedure(s) Performed: COLONOSCOPY POLYPECTOMY, INTESTINE  Patient location during evaluation: PACU Anesthesia Type: General Level of consciousness: awake and awake and alert Pain management: satisfactory to patient Vital Signs Assessment: post-procedure vital signs reviewed and stable Respiratory status: spontaneous breathing Cardiovascular status: stable Anesthetic complications: no   No notable events documented.   Last Vitals:  Vitals:   12/30/23 0843 12/30/23 0930  BP: 136/69 (!) 91/44  Pulse: 75 71  Resp: 18 20  Temp: (!) 35.6 C (!) 35.9 C  SpO2: 98% 99%    Last Pain:  Vitals:   12/30/23 0930  TempSrc: Temporal  PainSc: 0-No pain                 VAN STAVEREN,Taylore Hinde

## 2023-12-30 NOTE — Transfer of Care (Signed)
 Immediate Anesthesia Transfer of Care Note  Patient: Lynn Leonard  Procedure(s) Performed: COLONOSCOPY POLYPECTOMY, INTESTINE  Patient Location: Endoscopy Unit  Anesthesia Type:General  Level of Consciousness: awake, alert , and oriented  Airway & Oxygen Therapy: Patient Spontanous Breathing  Post-op Assessment: Report given to RN and Post -op Vital signs reviewed and stable  Post vital signs: Reviewed and stable  Last Vitals:  Vitals Value Taken Time  BP 91/44 12/30/23 09:30  Temp    Pulse 69 12/30/23 09:31  Resp 21 12/30/23 09:31  SpO2 98 % 12/30/23 09:31  Vitals shown include unfiled device data.  Last Pain:  Vitals:   12/30/23 0843  TempSrc: Temporal  PainSc: 0-No pain         Complications: No notable events documented.

## 2023-12-30 NOTE — Op Note (Signed)
 [facility] Patient Name: Lynn Leonard Procedure Date: 12/30/2023 8:14 AM MRN: [id number] Account #: [id number] Date of Birth: 1965/07/08 Admit Type: Outpatient Age: 58 Room: [facility] ENDO ROOM 2 Gender: Female Note Status: Finalized Instrument Name: Colon Scope [id number] Procedure:             Colonoscopy Indications:           High risk colon cancer surveillance: Personal history                         of colonic polyps Providers:             Elspeth Ozell Jungling DO, DO Medicines:             Monitored Anesthesia Care Complications:         No immediate complications. Estimated blood loss:                         Minimal. Procedure:             Pre-Anesthesia Assessment:                        - Prior to the procedure, a History and Physical was                         performed, and patient medications and allergies were                         reviewed. The patient is competent. The risks and                         benefits of the procedure and the sedation options and                         risks were discussed with the patient. All questions                         were answered and informed consent was obtained.                         Patient identification and proposed procedure were                         verified by the physician, the nurse, the anesthetist                         and the technician in the endoscopy suite. Mental                         Status Examination: alert and oriented. Airway                         Examination: normal oropharyngeal airway and neck                         mobility. Respiratory Examination: clear to                         auscultation. CV Examination: RRR, no murmurs, no S3  or S4. Prophylactic Antibiotics: The patient does not                         require prophylactic antibiotics. Prior                         Anticoagulants: The patient has taken no anticoagulant                          or antiplatelet agents. ASA Grade Assessment: III - A                         patient with severe systemic disease. After reviewing                         the risks and benefits, the patient was deemed in                         satisfactory condition to undergo the procedure. The                         anesthesia plan was to use monitored anesthesia care                         (MAC). Immediately prior to administration of                         medications, the patient was re-assessed for adequacy                         to receive sedatives. The heart rate, respiratory                         rate, oxygen saturations, blood pressure, adequacy of                         pulmonary ventilation, and response to care were                         monitored throughout the procedure. The physical                         status of the patient was re-assessed after the                         procedure.                        After obtaining informed consent, the colonoscope was                         passed under direct vision. Throughout the procedure,                         the patient's blood pressure, pulse, and oxygen                         saturations were monitored continuously. The  Colonoscope was introduced through the anus and                         advanced to the the terminal ileum, with                         identification of the appendiceal orifice and IC                         valve. The colonoscopy was somewhat difficult due to a                         redundant colon. Successful completion of the                         procedure was aided by applying abdominal pressure and                         lavage. The patient tolerated the procedure well. The                         quality of the bowel preparation was evaluated using                         the BBPS [facility] Bowel Preparation Scale) with scores                         of:  Right Colon = 2 (minor amount of residual                         staining, small fragments of stool and/or opaque                         liquid, but mucosa seen well), Transverse Colon = 3                         (entire mucosa seen well with no residual staining,                         small fragments of stool or opaque liquid) and Left                         Colon = 2 (minor amount of residual staining, small                         fragments of stool and/or opaque liquid, but mucosa                         seen well). The total BBPS score equals 7. The quality                         of the bowel preparation was good. The terminal ileum,                         ileocecal valve, appendiceal orifice, and rectum were  photographed. Findings:      The perianal and digital rectal examinations were normal. Pertinent       negatives include normal sphincter tone.      The terminal ileum appeared normal. Estimated blood loss: none.      Multiple small-mouthed diverticula were found in the entire colon.       Estimated blood loss: none.      Four sessile polyps were found in the descending colon (3) and       transverse colon (1). The polyps were 2 to 5 mm in size. These polyps       were removed with a cold snare. Resection and retrieval were complete.       Estimated blood loss was minimal.      The exam was otherwise without abnormality on direct and retroflexion       views. Impression:            - The examined portion of the ileum was normal.                        - Diverticulosis in the entire examined colon.                        - Four 2 to 5 mm polyps in the descending colon and in                         the transverse colon, removed with a cold snare.                         Resected and retrieved.                        - The examination was otherwise normal on direct and                         retroflexion views. Recommendation:        - Patient has  a contact number available for                         emergencies. The signs and symptoms of potential                         delayed complications were discussed with the patient.                         Return to normal activities tomorrow. Written                         discharge instructions were provided to the patient.                        - Discharge patient to home.                        - Resume previous diet.                        - Continue present medications.                        -  No ibuprofen, naproxen, or other non-steroidal                         anti-inflammatory drugs for 5 days after polyp removal.                        - Await pathology results.                        - can resume GLP-1 medication                        - Repeat colonoscopy for surveillance based on                         pathology results.                        - Return to referring physician as previously                         scheduled.                        - The findings and recommendations were discussed with                         the patient. Procedure Code(s):     --- Professional ---                        [phone], Colonoscopy, flexible; with removal of                         tumor(s), polyp(s), or other lesion(s) by snare                         technique Diagnosis Code(s):     --- Professional ---                        Z86.010, Personal history of colonic polyps                        D12.4, Benign neoplasm of descending colon                        D12.3, Benign neoplasm of transverse colon (hepatic                         flexure or splenic flexure)                        K57.30, Diverticulosis of large intestine without                         perforation or abscess without bleeding CPT copyright 2022 American Medical Association. All rights reserved. The codes documented in this report are preliminary and upon coder review may  be revised to meet current compliance  requirements. Attending Participation:      I personally performed the entire procedure. Elspeth Jungling, DO Elspeth Ozell Jungling DO, DO 12/30/2023 9:31:49 AM This report has been signed electronically. Number  of Addenda: 0 Note Initiated On: 12/30/2023 8:14 AM Scope Withdrawal Time: 0 hours 16 minutes 19 seconds  Total Procedure Duration: 0 hours 24 minutes 39 seconds  Estimated Blood Loss:  Estimated blood loss was minimal.      [facility]

## 2023-12-30 NOTE — Anesthesia Preprocedure Evaluation (Signed)
 Anesthesia Evaluation  Patient identified by MRN, date of birth, ID band Patient awake    Reviewed: Allergy & Precautions, NPO status , Patient's Chart, lab work & pertinent test results  History of Anesthesia Complications (+) PONV and history of anesthetic complications  Airway Mallampati: III  TM Distance: >3 FB Neck ROM: Full    Dental  (+) Teeth Intact   Pulmonary neg pulmonary ROS, sleep apnea and Continuous Positive Airway Pressure Ventilation , former smoker   Pulmonary exam normal  + decreased breath sounds      Cardiovascular Exercise Tolerance: Good negative cardio ROS Normal cardiovascular exam Rhythm:Regular Rate:Normal     Neuro/Psych   Anxiety     negative neurological ROS  negative psych ROS   GI/Hepatic negative GI ROS, Neg liver ROS,,,  Endo/Other  negative endocrine ROSdiabetes, Type 2, Oral Hypoglycemic AgentsHypothyroidism  Class 4 obesity  Renal/GU negative Renal ROS  negative genitourinary   Musculoskeletal   Abdominal  (+) + obese  Peds negative pediatric ROS (+)  Hematology negative hematology ROS (+) Blood dyscrasia, anemia   Anesthesia Other Findings Past Medical History: No date: Diabetes mellitus without complication (HCC) No date: DVT (deep venous thrombosis) (HCC) No date: Hashimoto's thyroiditis No date: PONV (postoperative nausea and vomiting) No date: Sleep apnea No date: Thyroid disease  Past Surgical History: No date: ANKLE SURGERY 10/24/2018: COLONOSCOPY WITH PROPOFOL ; N/A     Comment:  Procedure: COLONOSCOPY WITH PROPOFOL ;  Surgeon: Viktoria Lamar DASEN, MD;  Location: [facility] ENDOSCOPY;  Service:               Endoscopy;  Laterality: N/A; No date: FRACTURE SURGERY No date: NASAL SEPTUM SURGERY No date: uterine ablation     Reproductive/Obstetrics negative OB ROS                              Anesthesia Physical Anesthesia  Plan  ASA: 3  Anesthesia Plan: General   Post-op Pain Management:    Induction: Intravenous  PONV Risk Score and Plan: Propofol  infusion and TIVA  Airway Management Planned: Natural Airway and Nasal Cannula  Additional Equipment:   Intra-op Plan:   Post-operative Plan:   Informed Consent: I have reviewed the patients History and Physical, chart, labs and discussed the procedure including the risks, benefits and alternatives for the proposed anesthesia with the patient or authorized representative who has indicated his/her understanding and acceptance.     Dental Advisory Given  Plan Discussed with: CRNA  Anesthesia Plan Comments:         Anesthesia Quick Evaluation

## 2023-12-30 NOTE — Anesthesia Procedure Notes (Signed)
 Date/Time: 12/30/2023 8:56 AM  Performed by: Dominica Krabbe, CRNAPre-anesthesia Checklist: Patient identified, Emergency Drugs available, Suction available, Patient being monitored and Timeout performed Patient Re-evaluated:Patient Re-evaluated prior to induction Oxygen Delivery Method: Nasal cannula Preoxygenation: Pre-oxygenation with 100% oxygen Induction Type: IV induction

## 2023-12-30 NOTE — H&P (Signed)
 Pre-Procedure H&P   Patient ID: Lynn Leonard is a 58 y.o. female.  Gastroenterology Provider: Elspeth Ozell Jungling, DO  Referring Provider: Dr. Alla PCP: Alla Amis, MD  Date: 12/30/2023  HPI [name] is a 58 y.o. female who presents today for Colonoscopy for Personal and family history of colon polyps .  Last underwent colonoscopy in June 2020 with 1 tubular adenoma and pandiverticulosis.  Colon was reportedly redundant and tortuous with significant looping I was not able to completely reach the cecum, but it was visualized.  Every other day bowel movement without melena or hematochezia.  Creatinine 0.7 hemoglobin 11.5 MCV 94 platelets 302,000  Mother with a history of colon polyps.  No history of colon cancer   Past Medical History:  Diagnosis Date   Diabetes mellitus without complication (HCC)    DVT (deep venous thrombosis) (HCC)    Hashimoto's thyroiditis    PONV (postoperative nausea and vomiting)    Sleep apnea    Thyroid disease     Past Surgical History:  Procedure Laterality Date   ANKLE SURGERY     COLONOSCOPY WITH PROPOFOL  N/A 10/24/2018   Procedure: COLONOSCOPY WITH PROPOFOL ;  Surgeon: Viktoria Lamar DASEN, MD;  Location: [facility] ENDOSCOPY;  Service: Endoscopy;  Laterality: N/A;   FRACTURE SURGERY     NASAL SEPTUM SURGERY     uterine ablation      Family History Mother- colon polyps No other h/o GI disease or malignancy  Review of Systems  Constitutional:  Negative for activity change, appetite change, chills, diaphoresis, fatigue, fever and unexpected weight change.  HENT:  Negative for trouble swallowing and voice change.   Respiratory:  Negative for shortness of breath and wheezing.   Cardiovascular:  Negative for chest pain, palpitations and leg swelling.  Gastrointestinal:  Negative for abdominal distention, abdominal pain, anal bleeding, blood in stool, constipation, diarrhea, nausea, rectal pain and vomiting.   Musculoskeletal:  Negative for arthralgias and myalgias.  Skin:  Negative for color change and pallor.  Neurological:  Negative for dizziness, syncope and weakness.  Psychiatric/Behavioral:  Negative for confusion.   All other systems reviewed and are negative.    Medications No current facility-administered medications on file prior to encounter.   Current Outpatient Medications on File Prior to Encounter  Medication Sig Dispense Refill   atorvastatin (LIPITOR) 40 MG tablet Take 40 mg by mouth daily.  0   FLUoxetine (PROZAC) 40 MG capsule Take 40 mg by mouth daily.  0   levothyroxine (SYNTHROID) 175 MCG tablet Take 175 mcg by mouth every morning.     lisinopril (ZESTRIL) 5 MG tablet Take 5 mg by mouth daily.     fluticasone (FLONASE) 50 MCG/ACT nasal spray Place 2 sprays into both nostrils daily. (Patient not taking: Reported on 03/18/2023)  5   halobetasol  (ULTRAVATE ) 0.05 % ointment Apply topically 2 (two) times daily. Apply twice daily for 3 weeks then weekends only. Avoid applying to face, groin, and axilla. Use as directed. Risk of skin atrophy with long-term use reviewed. (Patient not taking: Reported on 03/18/2023) 50 g 1   liothyronine (CYTOMEL) 5 MCG tablet Take 5 mcg by mouth daily.     metFORMIN (GLUCOPHAGE) 500 MG tablet Take 500 mg by mouth daily.  0   pimecrolimus  (ELIDEL ) 1 % cream Apply topically 2 (two) times daily. To eyelids as needed for rash. (Patient not taking: Reported on 03/18/2023) 30 g 1   Semaglutide, 1 MG/DOSE, (OZEMPIC, 1 MG/DOSE,) 4 MG/3ML  SOPN Inject 1 mg into the skin once a week.     traZODone (DESYREL) 50 MG tablet Take 50 mg by mouth daily as needed. (Patient not taking: Reported on 03/18/2023)  0    Pertinent medications related to GI and procedure were reviewed by me with the patient prior to the procedure   Current Facility-Administered Medications:    0.9 %  sodium chloride  infusion, , Intravenous, Continuous, Onita Elspeth Sharper, DO, Last  Rate: 20 mL/hr at 12/30/23 0850, New Bag at 12/30/23 0850  sodium chloride  20 mL/hr at 12/30/23 9149       Allergies  Allergen Reactions   Iodine Swelling   Shellfish Allergy Anaphylaxis   Shellfish-Derived Products Hives, Anaphylaxis and Swelling   Allergies were reviewed by me prior to the procedure  Objective   Body mass index is 42.33 kg/m. Vitals:   12/30/23 0843  BP: 136/69  Pulse: 75  Resp: 18  Temp: (!) 96.1 F (35.6 C)  TempSrc: Temporal  SpO2: 98%  Weight: 111.9 kg  Height: 5' 4 (1.626 m)     Physical Exam Vitals and nursing note reviewed.  Constitutional:      General: She is not in acute distress.    Appearance: Normal appearance. She is not ill-appearing, toxic-appearing or diaphoretic.  HENT:     Head: Normocephalic and atraumatic.     Nose: Nose normal.     Mouth/Throat:     Mouth: Mucous membranes are moist.     Pharynx: Oropharynx is clear.  Eyes:     General: No scleral icterus.    Extraocular Movements: Extraocular movements intact.  Cardiovascular:     Rate and Rhythm: Normal rate and regular rhythm.     Heart sounds: Normal heart sounds. No murmur heard.    No friction rub. No gallop.  Pulmonary:     Effort: Pulmonary effort is normal. No respiratory distress.     Breath sounds: Normal breath sounds. No wheezing, rhonchi or rales.  Abdominal:     General: Abdomen is flat. Bowel sounds are normal. There is no distension.     Palpations: Abdomen is soft.     Tenderness: There is no abdominal tenderness. There is no guarding or rebound.  Musculoskeletal:     Cervical back: Neck supple.     Right lower leg: No edema.     Left lower leg: No edema.  Skin:    General: Skin is warm and dry.     Coloration: Skin is not jaundiced or pale.  Neurological:     General: No focal deficit present.     Mental Status: She is alert and oriented to person, place, and time. Mental status is at baseline.  Psychiatric:        Mood and Affect: Mood  normal.        Behavior: Behavior normal.        Thought Content: Thought content normal.        Judgment: Judgment normal.      Assessment:  [name] is a 58 y.o. female  who presents today for Colonoscopy for Personal and family history of colon polyps .  Plan:  Colonoscopy with possible intervention today  Colonoscopy with possible biopsy, control of bleeding, polypectomy, and interventions as necessary has been discussed with the patient/patient representative. Informed consent was obtained from the patient/patient representative after explaining the indication, nature, and risks of the procedure including but not limited to death, bleeding, perforation, missed neoplasm/lesions, cardiorespiratory compromise,  and reaction to medications. Opportunity for questions was given and appropriate answers were provided. Patient/patient representative has verbalized understanding is amenable to undergoing the procedure.   Elspeth Ozell Jungling, DO  [facility]  Portions of the record may have been created with voice recognition software. Occasional wrong-word or 'sound-a-like' substitutions may have occurred due to the inherent limitations of voice recognition software.  Read the chart carefully and recognize, using context, where substitutions may have occurred.

## 2023-12-30 NOTE — Interval H&P Note (Signed)
 History and Physical Interval Note: Preprocedure H&P from 12/30/23  was reviewed and there was no interval change after seeing and examining the patient.  Written consent was obtained from the patient after discussion of risks, benefits, and alternatives. Patient has consented to proceed with Colonoscopy with possible intervention   12/30/2023 8:51 AM  Lynn Leonard  has presented today for surgery, with the diagnosis of Z86.0100 (ICD-10-CM) - History of colon polyps Z83.719 (ICD-10-CM) - Family hx colonic polyps.  The various methods of treatment have been discussed with the patient and family. After consideration of risks, benefits and other options for treatment, the patient has consented to  Procedure(s) with comments: COLONOSCOPY (N/A) - DM  Ozempic as a surgical intervention.  The patient's history has been reviewed, patient examined, no change in status, stable for surgery.  I have reviewed the patient's chart and labs.  Questions were answered to the patient's satisfaction.     Lynn Leonard

## 2024-01-01 LAB — SURGICAL PATHOLOGY
